# Patient Record
Sex: Male | Born: 1985 | Race: Black or African American | Hispanic: No | Marital: Single | Smoking: Current some day smoker
Health system: Southern US, Community
[De-identification: ages and names within clinical notes are randomized; demographics above are authoritative.]

---
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
## 2006-12-17 ENCOUNTER — Inpatient Hospital Stay (HOSPITAL_COMMUNITY): Admission: AC | Admit: 2006-12-17 | Discharge: 2006-12-18 | Payer: Self-pay

## 2006-12-24 ENCOUNTER — Emergency Department (HOSPITAL_COMMUNITY): Admission: EM | Admit: 2006-12-24 | Discharge: 2006-12-24 | Payer: Self-pay | Admitting: Emergency Medicine

## 2008-08-05 ENCOUNTER — Emergency Department (HOSPITAL_COMMUNITY): Admission: EM | Admit: 2008-08-05 | Discharge: 2008-08-05 | Payer: Self-pay | Admitting: Emergency Medicine

## 2009-04-27 ENCOUNTER — Emergency Department (HOSPITAL_COMMUNITY): Admission: EM | Admit: 2009-04-27 | Discharge: 2009-04-27 | Payer: Self-pay | Admitting: Emergency Medicine

## 2009-05-29 ENCOUNTER — Emergency Department (HOSPITAL_COMMUNITY): Admission: EM | Admit: 2009-05-29 | Discharge: 2009-05-29 | Payer: Self-pay | Admitting: Emergency Medicine

## 2010-06-24 LAB — RAPID STREP SCREEN (MED CTR MEBANE ONLY): Streptococcus, Group A Screen (Direct): NEGATIVE
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
## 2011-01-15 LAB — TYPE AND SCREEN

## 2011-01-15 LAB — I-STAT 8, (EC8 V) (CONVERTED LAB)
Acid-base deficit: 7 — ABNORMAL HIGH
BUN: 10
Bicarbonate: 17.8 — ABNORMAL LOW
HCT: 47
Hemoglobin: 16
Operator id: 277751
Sodium: 139

## 2011-01-15 LAB — CBC
HCT: 42.3
Hemoglobin: 14.4
MCHC: 34
RBC: 4.65
RDW: 13.6

## 2011-01-15 LAB — POCT I-STAT CREATININE: Creatinine, Ser: 1.1

## 2011-01-15 LAB — ABO/RH: ABO/RH(D): O POS

## 2011-05-10 ENCOUNTER — Emergency Department (HOSPITAL_COMMUNITY): Payer: BC Managed Care – PPO

## 2011-05-10 ENCOUNTER — Emergency Department (HOSPITAL_COMMUNITY)
Admission: EM | Admit: 2011-05-10 | Discharge: 2011-05-10 | Disposition: A | Discharge status: Home / Self Care | Payer: BC Managed Care – PPO | Attending: Emergency Medicine | Admitting: Emergency Medicine

## 2011-05-10 ENCOUNTER — Encounter (HOSPITAL_COMMUNITY): Payer: Self-pay

## 2011-05-10 DIAGNOSIS — F17200 Nicotine dependence, unspecified, uncomplicated: Secondary | ICD-10-CM | POA: Insufficient documentation

## 2011-05-10 DIAGNOSIS — H5789 Other specified disorders of eye and adnexa: Secondary | ICD-10-CM | POA: Insufficient documentation

## 2011-05-10 DIAGNOSIS — H2102 Hyphema, left eye: Secondary | ICD-10-CM

## 2011-05-10 DIAGNOSIS — H5460 Unqualified visual loss, one eye, unspecified: Secondary | ICD-10-CM | POA: Insufficient documentation

## 2011-05-10 DIAGNOSIS — H538 Other visual disturbances: Secondary | ICD-10-CM | POA: Insufficient documentation

## 2011-05-10 DIAGNOSIS — H2100 Hyphema, unspecified eye: Secondary | ICD-10-CM | POA: Insufficient documentation

## 2011-05-10 DIAGNOSIS — H53149 Visual discomfort, unspecified: Secondary | ICD-10-CM | POA: Insufficient documentation

## 2011-05-10 DIAGNOSIS — S0230XA Fracture of orbital floor, unspecified side, initial encounter for closed fracture: Secondary | ICD-10-CM | POA: Insufficient documentation

## 2011-05-10 DIAGNOSIS — H5710 Ocular pain, unspecified eye: Secondary | ICD-10-CM | POA: Insufficient documentation

## 2011-05-10 MED ORDER — HYDROCODONE-ACETAMINOPHEN 5-325 MG PO TABS: 1.0000 | ORAL_TABLET | ORAL | Status: AC | PRN

## 2011-05-10 MED ORDER — PREDNISOLONE ACETATE 1 % OP SUSP
1.0000 [drp] | Freq: Four times a day (QID) | OPHTHALMIC | Status: DC
  Filled 2011-05-10: qty 1

## 2011-05-10 MED ORDER — ATROPINE SULFATE 1 % OP SOLN
1.0000 [drp] | Freq: Two times a day (BID) | OPHTHALMIC | Status: DC
  Filled 2011-05-10: qty 2

## 2011-05-10 MED ORDER — PREDNISOLONE ACETATE 1 % OP SUSP: 1.0000 [drp] | Freq: Four times a day (QID) | OPHTHALMIC | Status: AC

## 2011-05-10 MED ORDER — ATROPINE SULFATE 1 % OP SOLN: 1.0000 [drp] | Freq: Two times a day (BID) | OPHTHALMIC | Status: AC

## 2011-05-10 MED ORDER — HYDROCODONE-ACETAMINOPHEN 5-325 MG PO TABS
2.0000 | ORAL_TABLET | Freq: Once | ORAL | Status: AC
  Administered 2011-05-10: 2 via ORAL
  Filled 2011-05-10: qty 2

## 2011-05-10 NOTE — ED Notes (Signed)
Discharge inst and meds given for patient to take home.  Instructions given

## 2011-05-10 NOTE — ED Notes (Signed)
Left eye red and painful, sts also has headaches.
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
## 2011-05-10 NOTE — ED Notes (Signed)
Both eyes drops instilled in left eye.
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
## 2011-05-10 NOTE — ED Notes (Signed)
MD at bedside. 

## 2011-05-11 NOTE — ED Provider Notes (Signed)
I saw and evaluated the patient, reviewed the resident's note and I agree with the findings and plan.  Milderd Manocchio P Selso Mannor, MD 05/11/11 0037 

## 2019-04-16 ENCOUNTER — Ambulatory Visit (HOSPITAL_COMMUNITY)
Admission: EM | Admit: 2019-04-16 | Discharge: 2019-04-16 | Disposition: A | Discharge status: Home / Self Care | Payer: Self-pay | Attending: Internal Medicine | Admitting: Internal Medicine

## 2019-04-16 ENCOUNTER — Ambulatory Visit (INDEPENDENT_AMBULATORY_CARE_PROVIDER_SITE_OTHER): Payer: Self-pay

## 2019-04-16 ENCOUNTER — Other Ambulatory Visit: Payer: Self-pay

## 2019-04-16 ENCOUNTER — Encounter (HOSPITAL_COMMUNITY): Payer: Self-pay

## 2019-04-16 DIAGNOSIS — R1013 Epigastric pain: Secondary | ICD-10-CM

## 2019-04-16 DIAGNOSIS — R109 Unspecified abdominal pain: Secondary | ICD-10-CM

## 2019-04-16 LAB — COMPREHENSIVE METABOLIC PANEL
ALT: 23 U/L (ref 0–44)
AST: 22 U/L (ref 15–41)
Albumin: 4.3 g/dL (ref 3.5–5.0)
Alkaline Phosphatase: 63 U/L (ref 38–126)
Anion gap: 9 (ref 5–15)
BUN: 17 mg/dL (ref 6–20)
CO2: 28 mmol/L (ref 22–32)
Calcium: 9.2 mg/dL (ref 8.9–10.3)
Chloride: 100 mmol/L (ref 98–111)
Creatinine, Ser: 1.4 mg/dL — ABNORMAL HIGH (ref 0.61–1.24)
GFR calc Af Amer: >60 mL/min (ref >60)
GFR calc non Af Amer: >60 mL/min (ref >60)
Glucose, Bld: 101 mg/dL — ABNORMAL HIGH (ref 70–99)
Potassium: 4 mmol/L (ref 3.5–5.1)
Sodium: 137 mmol/L (ref 135–145)
Total Bilirubin: 0.6 mg/dL (ref 0.3–1.2)
Total Protein: 7.4 g/dL (ref 6.5–8.1)

## 2019-04-16 LAB — CBC
HCT: 44.8 % (ref 39.0–52.0)
Hemoglobin: 14.5 g/dL (ref 13.0–17.0)
MCH: 29.2 pg (ref 26.0–34.0)
MCHC: 32.4 g/dL (ref 30.0–36.0)
MCV: 90.1 fL (ref 80.0–100.0)
Platelets: 279 10*3/uL (ref 150–400)
RBC: 4.97 MIL/uL (ref 4.22–5.81)
RDW: 12.5 % (ref 11.5–15.5)
WBC: 6.3 10*3/uL (ref 4.0–10.5)
nRBC: 0 % (ref 0.0–0.2)

## 2019-04-16 MED ORDER — PANTOPRAZOLE SODIUM 20 MG PO TBEC: 20.0000 mg | DELAYED_RELEASE_TABLET | Freq: Every day | ORAL | 0 refills | Status: AC

## 2019-04-16 NOTE — ED Provider Notes (Signed)
[facility]    CSN: [id number] Arrival date & time: 04/16/19  1816      History   Chief Complaint Chief Complaint  Patient presents with  . Abdominal Pain    HPI [name] is a 34 y.o. male with no past medical history comes to urgent care with complaints of abdominal pain that started 3 days ago.  Patient says pain is mainly in the right upper quadrant and epigastric region.  Pain has been intermittent.  He denies any aggravating factors.  He is tried several bouts of laxatives with no improvement.  He denies any history of constipation.  No nausea or vomiting.  No abdominal distention.  He describes the pain as his intestines being twisted over itself.  No fever or chills.  No sick contacts.  No changes in weight.  Patient denies any over-the-counter pain medication use.   HPI  History reviewed. No pertinent past medical history.  There are no problems to display for this patient.   History reviewed. No pertinent surgical history.     Home Medications    Prior to Admission medications   Medication Sig Start Date End Date Taking? Authorizing Provider  pantoprazole (PROTONIX) 20 MG tablet Take 1 tablet (20 mg total) by mouth daily. 04/16/19   LampteyBritta Mccreedy, MD    Family History Family History  Problem Relation Age of Onset  . Healthy Mother   . Healthy Father     Social History Social History   Tobacco Use  . Smoking status: Current Some Day Smoker    Types: E-cigarettes  . Smokeless tobacco: Never Used  . Tobacco comment: rare  Substance Use Topics  . Alcohol use: Yes    Comment: occ  . Drug use: No     Allergies   Patient has no known allergies.   Review of Systems Review of Systems  Constitutional: Negative for activity change, chills, fatigue and fever.  HENT: Negative.   Respiratory: Negative.  Negative for chest tightness, shortness of breath and wheezing.   Cardiovascular: Negative.   Gastrointestinal: Negative for  diarrhea, nausea and rectal pain.  Genitourinary: Negative.   Musculoskeletal: Negative.  Negative for arthralgias, joint swelling and myalgias.  Neurological: Negative for dizziness, light-headedness and headaches.  Psychiatric/Behavioral: Negative for confusion and decreased concentration.     Physical Exam Triage Vital Signs ED Triage Vitals  Enc Vitals Group     BP      Pulse      Resp      Temp      Temp src      SpO2      Weight      Height      Head Circumference      Peak Flow      Pain Score      Pain Loc      Pain Edu?      Excl. in GC?    No data found.  Updated Vital Signs BP (!) 120/97 (BP Location: Left Arm)   Pulse 92   Temp 99.1 F (37.3 C) (Oral)   Resp 16   SpO2 100%   Visual Acuity Right Eye Distance:   Left Eye Distance:   Bilateral Distance:    Right Eye Near:   Left Eye Near:    Bilateral Near:     Physical Exam Vitals and nursing note reviewed.  Constitutional:      General: He is not in acute distress.  Appearance: He is well-developed. He is not ill-appearing.  Cardiovascular:     Rate and Rhythm: Normal rate and regular rhythm.  Pulmonary:     Effort: Pulmonary effort is normal. No respiratory distress.     Breath sounds: Normal breath sounds. No rhonchi or rales.  Abdominal:     General: Bowel sounds are normal.     Palpations: Abdomen is rigid. There is no shifting dullness, fluid wave, hepatomegaly or splenomegaly.     Tenderness: There is no abdominal tenderness.     Hernia: No hernia is present.  Skin:    General: Skin is warm.     Coloration: Skin is not jaundiced, mottled or pale.  Neurological:     Mental Status: He is alert.      UC Treatments / Results  Labs (all labs ordered are listed, but only abnormal results are displayed) Labs Reviewed  COMPREHENSIVE METABOLIC PANEL - Abnormal; Notable for the following components:      Result Value   Glucose, Bld 101 (*)    Creatinine, Ser 1.40 (*)    All other  components within normal limits  CBC    EKG   Radiology No results found.  Procedures Procedures (including critical care time)  Medications Ordered in UC Medications - No data to display  Initial Impression / Assessment and Plan / UC Course  I have reviewed the triage vital signs and the nursing notes.  Pertinent labs & imaging results that were available during my care of the patient were reviewed by me and considered in my medical decision making (see chart for details).     1.  Abdominal pain: KUB is negative for acute intra-abdominal process.  No evidence of constipation. Patient's pain is in the epigastric region.  Patient will get a trial of Protonix.  If patient's symptoms does not improve after that, he may benefit from gastroenterology evaluation.  If patient symptoms worsens, he is advised to reach out to the urgent care #5 the virtual visit platform or in person. Final Clinical Impressions(s) / UC Diagnoses   Final diagnoses:  Abdominal pain, epigastric   Discharge Instructions   None    ED Prescriptions    Medication Sig Dispense Auth. Provider   pantoprazole (PROTONIX) 20 MG tablet Take 1 tablet (20 mg total) by mouth daily. 30 tablet Derral Colucci, Myrene Galas, MD     PDMP not reviewed this encounter.   Chase Picket, MD 04/19/19 [phone]

## 2019-04-16 NOTE — ED Triage Notes (Signed)
Patient presents to Urgent Care with complaints of RUQ squeezing abdominal pain since a few nights ago. Patient reports it faded during the following day and has been intermittent ever since, pt states his BMs have been irregular, has tried some prune juice and ex-lax.

## 2019-04-28 ENCOUNTER — Emergency Department (HOSPITAL_COMMUNITY): Payer: Self-pay

## 2019-04-28 ENCOUNTER — Other Ambulatory Visit: Payer: Self-pay

## 2019-04-28 ENCOUNTER — Emergency Department (HOSPITAL_COMMUNITY)
Admission: EM | Admit: 2019-04-28 | Discharge: 2019-04-28 | Disposition: A | Discharge status: Home / Self Care | Payer: Self-pay | Attending: Emergency Medicine | Admitting: Emergency Medicine

## 2019-04-28 ENCOUNTER — Encounter (HOSPITAL_COMMUNITY): Payer: Self-pay

## 2019-04-28 DIAGNOSIS — S93402A Sprain of unspecified ligament of left ankle, initial encounter: Secondary | ICD-10-CM | POA: Insufficient documentation

## 2019-04-28 DIAGNOSIS — Y9367 Activity, basketball: Secondary | ICD-10-CM | POA: Insufficient documentation

## 2019-04-28 DIAGNOSIS — F17210 Nicotine dependence, cigarettes, uncomplicated: Secondary | ICD-10-CM | POA: Insufficient documentation

## 2019-04-28 DIAGNOSIS — Y999 Unspecified external cause status: Secondary | ICD-10-CM | POA: Insufficient documentation

## 2019-04-28 DIAGNOSIS — Y92310 Basketball court as the place of occurrence of the external cause: Secondary | ICD-10-CM | POA: Insufficient documentation

## 2019-04-28 DIAGNOSIS — Y33XXXA Other specified events, undetermined intent, initial encounter: Secondary | ICD-10-CM | POA: Insufficient documentation

## 2019-04-28 MED ORDER — NAPROXEN 500 MG PO TABS: 500.0000 mg | ORAL_TABLET | Freq: Two times a day (BID) | ORAL | 0 refills | Status: AC

## 2019-04-28 MED ORDER — IBUPROFEN 400 MG PO TABS
600.0000 mg | ORAL_TABLET | Freq: Once | ORAL | Status: AC
  Administered 2019-04-28: 600 mg via ORAL
  Filled 2019-04-28: qty 1

## 2019-04-28 MED ORDER — OXYCODONE-ACETAMINOPHEN 5-325 MG PO TABS
1.0000 | ORAL_TABLET | Freq: Once | ORAL | Status: AC
  Administered 2019-04-28: 1 via ORAL
  Filled 2019-04-28: qty 1

## 2019-04-28 NOTE — ED Notes (Signed)
Ortho placing left ankle brace on pt then pt will be discharged.

## 2019-04-28 NOTE — Discharge Instructions (Signed)
Wear the ankle brace and use the crutches as tolerated. You can start to bear weight on the leg as tolerated. Complete range of motion exercises to prevent stiffness. Follow-up with orthopedic provider listed below. Return to the ED if you start to experience worsening pain, swelling, redness or warmth of your joint or additional injuries.

## 2019-04-28 NOTE — Progress Notes (Signed)
Orthopedic Tech Progress Note Patient Details:  [name] 07/17/1985 [id number]  Ortho Devices Type of Ortho Device: ASO Ortho Device/Splint Location: LLE Ortho Device/Splint Interventions: Ordered, Application   Post Interventions Patient Tolerated: Well Instructions Provided: Care of device   Jennye Moccasin 04/28/2019, 7:16 PM

## 2019-04-28 NOTE — ED Provider Notes (Signed)
[name] Provider Note   CSN: [id number] Arrival date & time: 04/28/19  1745     History Chief Complaint  Patient presents with  . Ankle Injury    [name] is a 34 y.o. male who presents to ED with a chief complaint of left ankle pain.  States that he was playing basketball when he got fouled and had an injury to his left ankle.  It felt like the ankle inverted under him.  He denies any head injury or loss of consciousness.  He has been able to bear weight with pain after the accident.  Denies any pain prior to the injury.  Denies any prior fracture, dislocation or procedure in the area, history of septic joint, numbness in arms or legs, headache or blurry vision.  HPI     History reviewed. No pertinent past medical history.  There are no problems to display for this patient.   History reviewed. No pertinent surgical history.     Family History  Problem Relation Age of Onset  . Healthy Mother   . Healthy Father     Social History   Tobacco Use  . Smoking status: Current Some Day Smoker    Types: E-cigarettes  . Smokeless tobacco: Never Used  . Tobacco comment: rare  Substance Use Topics  . Alcohol use: Yes    Comment: occ  . Drug use: No    Home Medications Prior to Admission medications   Medication Sig Start Date End Date Taking? Authorizing Provider  naproxen (NAPROSYN) 500 MG tablet Take 1 tablet (500 mg total) by mouth 2 (two) times daily. 04/28/19   Evie Croston, PA-C  pantoprazole (PROTONIX) 20 MG tablet Take 1 tablet (20 mg total) by mouth daily. 04/16/19   LampteyMyrene Galas, MD    Allergies    Natural vegetable orange [psyllium]  Review of Systems   Review of Systems  Constitutional: Negative for chills and fever.  Musculoskeletal: Positive for arthralgias and joint swelling. Negative for gait problem.  Skin: Negative for wound.  Neurological: Negative for weakness and numbness.    Physical  Exam Updated Vital Signs BP 118/71 (BP Location: Right Arm)   Pulse 84   Temp 98.9 F (37.2 C) (Oral)   Resp 18   SpO2 100%   Physical Exam Vitals and nursing note reviewed.  Constitutional:      General: He is not in acute distress.    Appearance: He is well-developed. He is not diaphoretic.  HENT:     Head: Normocephalic and atraumatic.  Eyes:     General: No scleral icterus.    Conjunctiva/sclera: Conjunctivae normal.  Pulmonary:     Effort: Pulmonary effort is normal. No respiratory distress.  Musculoskeletal:        General: Swelling and tenderness present.     Cervical back: Normal range of motion.     Comments: Tenderness palpation of the left lateral ankle with associated edema.  Able to flex and extend the ankle but pain noted with inversion and eversion.  2+ DP pulse palpated.  No overlying erythema, warmth or wound noted.  Sensation intact to light touch of bilateral lower extremities.  Skin:    Findings: No rash.  Neurological:     Mental Status: He is alert.     ED Results / Procedures / Treatments   Labs (all labs ordered are listed, but only abnormal results are displayed) Labs Reviewed - No data to display  EKG None  Radiology DG Ankle Complete Left  Result Date: 04/28/2019 CLINICAL DATA:  Status post trauma. EXAM: LEFT ANKLE COMPLETE - 3+ VIEW COMPARISON:  None. FINDINGS: There is no evidence of fracture, dislocation, or joint effusion. There is no evidence of arthropathy or other focal bone abnormality. Mild lateral soft tissue swelling is seen. IMPRESSION: Mild lateral soft tissue swelling without evidence of acute osseous abnormality. Electronically Signed   By: Aram Candela M.D.   On: 04/28/2019 18:25    Procedures Procedures (including critical care time)  Medications Ordered in ED Medications  oxyCODONE-acetaminophen (PERCOCET/ROXICET) 5-325 MG per tablet 1 tablet (has no administration in time range)  ibuprofen (ADVIL) tablet 600 mg  (has no administration in time range)    ED Course  I have reviewed the triage vital signs and the nursing notes.  Pertinent labs & imaging results that were available during my care of the patient were reviewed by me and considered in my medical decision making (see chart for details).    MDM Rules/Calculators/A&P                      34 year old male presenting to the ED with left ankle pain.  He had an injury while playing basketball just prior to arrival.  He has tenderness palpation and edema of the left lateral ankle without significant changes to range of motion, overlying skin changes or changes to sensation.  He is able to bear weight with pain.  X-ray shows no acute osseous abnormality but does show soft tissue swelling in the area.  Suspect that his symptoms could be due to a sprain.  We will place him in an ankle ASO, crutches as needed and give anti-inflammatories as well as orthopedic follow-up.  At this time I doubt infectious or vascular cause of his symptoms at this he only started having any pain after his injury.  Patient is agreeable to the plan.  Pain controlled here.  Patient is hemodynamically stable, in NAD. Evaluation does not show pathology that would require ongoing emergent intervention or inpatient treatment. I explained the diagnosis to the patient. Pain has been managed and has no complaints prior to discharge. Patient is comfortable with above plan and is stable for discharge at this time. All questions were answered prior to disposition. Strict return precautions for returning to the ED were discussed. Encouraged follow up with PCP.   An After Visit Summary was printed and given to the patient.   Portions of this note were generated with Scientist, clinical (histocompatibility and immunogenetics). Dictation errors may occur despite best attempts at proofreading.  Final Clinical Impression(s) / ED Diagnoses Final diagnoses:  Sprain of left ankle, unspecified ligament, initial encounter    Rx  / DC Orders ED Discharge Orders         Ordered    naproxen (NAPROSYN) 500 MG tablet  2 times daily     04/28/19 1835           Dietrich Pates, PA-C 04/28/19 Roselie Awkward, MD 04/28/19 2242

## 2019-04-28 NOTE — ED Triage Notes (Signed)
Pt arrived POV C/O left ankle pain. Pt was playing basketball when he was fouled and rolled his ankle. Pt reports pain and not being able to put pressure on the left foot. Obvious swelling noted.

## 2020-04-30 ENCOUNTER — Encounter (HOSPITAL_COMMUNITY): Payer: Self-pay

## 2020-04-30 ENCOUNTER — Emergency Department (HOSPITAL_COMMUNITY): Payer: 59

## 2020-04-30 ENCOUNTER — Other Ambulatory Visit: Payer: Self-pay

## 2020-04-30 ENCOUNTER — Emergency Department (HOSPITAL_COMMUNITY)
Admission: EM | Admit: 2020-04-30 | Discharge: 2020-04-30 | Disposition: A | Discharge status: Home / Self Care | Payer: 59 | Attending: Emergency Medicine | Admitting: Emergency Medicine

## 2020-04-30 DIAGNOSIS — R748 Abnormal levels of other serum enzymes: Secondary | ICD-10-CM | POA: Insufficient documentation

## 2020-04-30 DIAGNOSIS — F17290 Nicotine dependence, other tobacco product, uncomplicated: Secondary | ICD-10-CM | POA: Diagnosis not present

## 2020-04-30 DIAGNOSIS — N50811 Right testicular pain: Secondary | ICD-10-CM | POA: Insufficient documentation

## 2020-04-30 DIAGNOSIS — R319 Hematuria, unspecified: Secondary | ICD-10-CM | POA: Insufficient documentation

## 2020-04-30 LAB — COMPREHENSIVE METABOLIC PANEL
ALT: 20 U/L (ref 0–44)
AST: 19 U/L (ref 15–41)
Albumin: 3.8 g/dL (ref 3.5–5.0)
Alkaline Phosphatase: 55 U/L (ref 38–126)
Anion gap: 10 (ref 5–15)
BUN: 8 mg/dL (ref 6–20)
CO2: 23 mmol/L (ref 22–32)
Calcium: 9.3 mg/dL (ref 8.9–10.3)
Chloride: 104 mmol/L (ref 98–111)
Creatinine, Ser: 1.04 mg/dL (ref 0.61–1.24)
GFR, Estimated: >60 mL/min (ref >60)
Glucose, Bld: 90 mg/dL (ref 70–99)
Potassium: 4.1 mmol/L (ref 3.5–5.1)
Sodium: 137 mmol/L (ref 135–145)
Total Bilirubin: 0.5 mg/dL (ref 0.3–1.2)
Total Protein: 6.5 g/dL (ref 6.5–8.1)

## 2020-04-30 LAB — URINALYSIS, ROUTINE W REFLEX MICROSCOPIC
Bacteria, UA: NONE SEEN
Bilirubin Urine: NEGATIVE
Glucose, UA: NEGATIVE mg/dL
Ketones, ur: NEGATIVE mg/dL
Leukocytes,Ua: NEGATIVE
Nitrite: NEGATIVE
Protein, ur: NEGATIVE mg/dL
Specific Gravity, Urine: 1.017 (ref 1.005–1.030)
pH: 5 (ref 5.0–8.0)

## 2020-04-30 LAB — CBC WITH DIFFERENTIAL/PLATELET
Abs Immature Granulocytes: 0.01 10*3/uL (ref 0.00–0.07)
Basophils Absolute: 0.1 10*3/uL (ref 0.0–0.1)
Basophils Relative: 1 %
Eosinophils Absolute: 0.1 10*3/uL (ref 0.0–0.5)
Eosinophils Relative: 2 %
HCT: 42 % (ref 39.0–52.0)
Hemoglobin: 13.5 g/dL (ref 13.0–17.0)
Immature Granulocytes: 0 %
Lymphocytes Relative: 51 %
Lymphs Abs: 2.7 10*3/uL (ref 0.7–4.0)
MCH: 29.6 pg (ref 26.0–34.0)
MCHC: 32.1 g/dL (ref 30.0–36.0)
MCV: 92.1 fL (ref 80.0–100.0)
Monocytes Absolute: 0.4 10*3/uL (ref 0.1–1.0)
Monocytes Relative: 7 %
Neutro Abs: 2.1 10*3/uL (ref 1.7–7.7)
Neutrophils Relative %: 39 %
Platelets: 279 10*3/uL (ref 150–400)
RBC: 4.56 MIL/uL (ref 4.22–5.81)
RDW: 12.8 % (ref 11.5–15.5)
WBC: 5.3 10*3/uL (ref 4.0–10.5)
nRBC: 0 % (ref 0.0–0.2)

## 2020-04-30 LAB — LIPASE, BLOOD: Lipase: 142 U/L — ABNORMAL HIGH (ref 11–51)

## 2020-04-30 MED ORDER — FENTANYL CITRATE (PF) 100 MCG/2ML IJ SOLN
50.0000 ug | Freq: Once | INTRAMUSCULAR | Status: AC
  Administered 2020-04-30: 50 ug via INTRAVENOUS
  Filled 2020-04-30: qty 2

## 2020-04-30 NOTE — Discharge Instructions (Addendum)
At this time there does not appear to be the presence of an emergent medical condition, however there is always the potential for conditions to change. Please read and follow the below instructions.  Please return to the Emergency Department immediately for any new or worsening symptoms. Please be sure to follow up with your Primary Care Provider within one week regarding your visit today; please call their office to schedule an appointment even if you are feeling better for a follow-up visit. Your lipase level was elevated today.  Please have this rechecked by your primary care provider in the next week.  This may be elevated due to a number of reasons, 1 of those being alcohol use.  If you drink alcohol please attempt to cut back.  Is important to have this lipase level rechecked to make sure it is not increasing over time.  Return to the ER if you develop any abdominal pain nausea or vomiting. Please call the specialist at [facility] urology for further evaluation of the blood in your urine and your testicle pain. 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  Go to the nearest Emergency Department immediately if: You have fever or chills You experience severe pain and redness of your scrotum. You have burning when you pee or see blood in your urine You have penile discharge or rash You have abdominal pain nausea or vomiting You have any new/concerning or worsening of symptoms  Please read the additional information packets attached to your discharge summary.  Do not take your medicine if  develop an itchy rash, swelling in your mouth or lips, or difficulty breathing; call 911 and seek immediate emergency medical attention if this occurs.  You may review your lab tests and imaging results in their entirety on your MyChart account.  Please discuss all results of fully with your primary care  provider and other specialist at your follow-up visit.  Note: Portions of this text may have been transcribed using voice recognition software. Every effort was made to ensure accuracy; however, inadvertent computerized transcription errors may still be present.

## 2020-04-30 NOTE — ED Notes (Signed)
Patient transported to Ultrasound 

## 2020-04-30 NOTE — ED Notes (Signed)
Patient verbalizes understanding of discharge instructions. Opportunity for questioning and answers were provided. Armband removed by staff, pt discharged from ED.  

## 2020-04-30 NOTE — ED Provider Notes (Signed)
[facility] EMERGENCY DEPARTMENT Provider Note   CSN: [id number] Arrival date & time: 04/30/20  0546     History Chief Complaint  Patient presents with  . Testicle Pain    [name] is a 35 y.o. male otherwise healthy no daily medication use.  Patient arrives for sudden onset right testicular pain around 2:30 AM, he reports he rolled over in his sleep and pain began he describes a severe sharp pain of his right testicle waxes and wanes without clear aggravating or alleviating factors no medication prior to arrival he reports pain will occasionally radiate up to his right lower back and is associated with some tingling sensation.  He reports that occasionally he will feel has of pain is on both testicles but is never severe in the left side.  Denies similar in the past.  Reports he is not sexually active and is not concerned for STI.  Denies fever/chills, nausea/vomiting, chest pain/shortness of breath, cough, abdominal pain, saddle paresthesias, bowel/bladder incontinence, urinary retention, fall/injury or any additional concerns.  HPI     History reviewed. No pertinent past medical history.  There are no problems to display for this patient.   History reviewed. No pertinent surgical history.     Family History  Problem Relation Age of Onset  . Healthy Mother   . Healthy Father     Social History   Tobacco Use  . Smoking status: Current Some Day Smoker    Types: E-cigarettes  . Smokeless tobacco: Never Used  . Tobacco comment: rare  Substance Use Topics  . Alcohol use: Yes    Comment: occ  . Drug use: No    Home Medications Prior to Admission medications   Medication Sig Start Date End Date Taking? Authorizing Provider  naproxen (NAPROSYN) 500 MG tablet Take 1 tablet (500 mg total) by mouth 2 (two) times daily. 04/28/19   Khatri, Hina, PA-C  pantoprazole (PROTONIX) 20 MG tablet Take 1 tablet (20 mg total) by mouth daily. 04/16/19   Lamptey,  Britta Mccreedy, MD    Allergies    Natural vegetable orange [psyllium]  Review of Systems   Review of Systems Ten systems are reviewed and are negative for acute change except as noted in the HPI  Physical Exam Updated Vital Signs BP (!) 124/94 (BP Location: Left Arm)   Pulse 68   Temp 97.8 F (36.6 C) (Oral)   Resp 16   Ht 5\' 7"  (1.702 m)   Wt 79.4 kg   SpO2 100%   BMI 27.41 kg/m   Physical Exam Constitutional:      General: He is not in acute distress.    Appearance: Normal appearance. He is well-developed. He is not ill-appearing or diaphoretic.  HENT:     Head: Normocephalic and atraumatic.  Eyes:     General: Vision grossly intact. Gaze aligned appropriately.     Pupils: Pupils are equal, round, and reactive to light.  Neck:     Trachea: Trachea and phonation normal.  Cardiovascular:     Rate and Rhythm: Normal rate and regular rhythm.  Pulmonary:     Effort: Pulmonary effort is normal. No respiratory distress.  Abdominal:     General: Abdomen is flat. There is no distension.     Palpations: Abdomen is soft.     Tenderness: There is no abdominal tenderness. There is no guarding or rebound.  Genitourinary:    Comments: Chaperone present during genital exam .  No external genital  lesions noted, no bumps on head of penis, specifically no vesicles concerning for herpes or chancre suggestive of syphilis.  No pain with palpation of the penis/glans, no discharge or urethritis noted.  Scrotum and testicles without erythema/swelling or tenderness to palpation. Cremasteric reflex intact bilaterally. No palpable hernia noted.  Musculoskeletal:        General: Normal range of motion.     Cervical back: Normal range of motion.  Skin:    General: Skin is warm and dry.  Neurological:     Mental Status: He is alert.     GCS: GCS eye subscore is 4. GCS verbal subscore is 5. GCS motor subscore is 6.     Comments: Speech is clear and goal oriented, follows  commands Major Cranial nerves without deficit, no facial droop Moves extremities without ataxia, coordination intact  Psychiatric:        Behavior: Behavior normal.     ED Results / Procedures / Treatments   Labs (all labs ordered are listed, but only abnormal results are displayed) Labs Reviewed  URINALYSIS, ROUTINE W REFLEX MICROSCOPIC - Abnormal; Notable for the following components:      Result Value   Hgb urine dipstick MODERATE (*)    All other components within normal limits  LIPASE, BLOOD - Abnormal; Notable for the following components:   Lipase 142 (*)    All other components within normal limits  CBC WITH DIFFERENTIAL/PLATELET  COMPREHENSIVE METABOLIC PANEL    EKG None  Radiology CT Renal Stone Study  Result Date: 04/30/2020 CLINICAL DATA:  35 year old male with right flank, testicular pain and hematuria since 0200 hours. EXAM: CT ABDOMEN AND PELVIS WITHOUT CONTRAST TECHNIQUE: Multidetector CT imaging of the abdomen and pelvis was performed following the standard protocol without IV contrast. COMPARISON:  Scrotal ultrasound with Doppler earlier today. CT Chest, Abdomen, and Pelvis 12/17/2006. FINDINGS: Lower chest: Negative.  No pericardial or pleural effusion. Hepatobiliary: Negative noncontrast liver and gallbladder. Pancreas: Negative. Spleen: Negative. Adrenals/Urinary Tract: Normal adrenal glands. Noncontrast kidneys appear symmetric and normal. No hydronephrosis or perinephric stranding. No nephrolithiasis. Ureters appear symmetric and normal. The distal ureters are decompressed. Diminutive and unremarkable urinary bladder. No urinary calculus identified. Stomach/Bowel: Negative large bowel aside from some redundancy and retained stool. Normal appendix arising from the cecum on series 3, image 54, is elongated and continues to the midline. Negative terminal ileum. No dilated small bowel. Decompressed stomach and duodenum. No free air, free fluid, mesenteric stranding.  Vascular/Lymphatic: Normal caliber abdominal aorta. No calcified atherosclerosis. Vascular patency is not evaluated in the absence of IV contrast. No lymphadenopathy. Reproductive: Negative; punctate dystrophic calcification of the right testis incidentally noted (inconsequential). Other: No pelvic free fluid. Musculoskeletal: Minor dextroconvex scoliosis, otherwise negative. IMPRESSION: Negative noncontrast CT Abdomen and Pelvis. No urinary calculus or explanation for flank pain.  Normal appendix. Electronically Signed   By: Odessa Fleming M.D.   On: 04/30/2020 09:52   US SCROTUM W/DOPPLER  Result Date: 04/30/2020 CLINICAL DATA:  35 year old male with right testicular pain since 0200 hours. EXAM: SCROTAL ULTRASOUND DOPPLER ULTRASOUND OF THE TESTICLES TECHNIQUE: Complete ultrasound examination of the testicles, epididymis, and other scrotal structures was performed. Color and spectral Doppler ultrasound were also utilized to evaluate blood flow to the testicles. COMPARISON:  None. FINDINGS: Right testicle Measurements: 4.6 x 2.0 x 3.1 cm. No mass or microlithiasis visualized. Left testicle Measurements: 4.3 x 2.2 x 2.5 cm. No mass or microlithiasis visualized. Right epididymis: Normal in size and appearance. No hypervascularity (image  18). Left epididymis:  Normal in size and appearance. Hydrocele:  None visualized. Varicocele:  None visualized. Pulsed Doppler interrogation of both testes demonstrates symmetric, normal low resistance arterial and venous waveforms bilaterally. IMPRESSION: Normal scrotal ultrasound with Doppler. Negative for torsion or mass. Electronically Signed   By: Odessa Fleming M.D.   On: 04/30/2020 06:35    Procedures Procedures   Medications Ordered in ED Medications  fentaNYL (SUBLIMAZE) injection 50 mcg (50 mcg Intravenous Given 04/30/20 1008)    ED Course  I have reviewed the triage vital signs and the nursing notes.  Pertinent labs & imaging results that were available during my care of  the patient were reviewed by me and considered in my medical decision making (see chart for details).  Clinical Course as of 04/30/20 1510  Wed Apr 30, 2020  1017 Elizabeth [BM]  3299 Dr. Laverle Patter [BM]    Clinical Course User Index [BM] Elizabeth Palau   MDM Rules/Calculators/A&P                         Additional history obtained from: 1. Nursing notes from this visit. 2. Review of electronic medical records.  Patient had negative Covid test 04/15/2020.  No pertinent recent ER visits. ---------------------------------------------- I ordered, reviewed and interpreted labs which include: CBC within normal limits no leukocytosis to suggest bacterial infection, no anemia. CMP within normal limits, no emergent electrolyte derangement, AKI, LFT elevations or gap. Urinalysis shows hemoglobin, no evidence for infection. Lipase elevated 142, patient denies alcohol use or symptoms suggestive of pancreatitis.  Encourage patient to follow-up with PCP for recheck of lipase level.  CT Renal Stone Study:  IMPRESSION:  Negative noncontrast CT Abdomen and Pelvis.  No urinary calculus or explanation for flank pain. Normal appendix.   US Scrotum w/ Doppler:  IMPRESSION:  Normal scrotal ultrasound with Doppler. Negative for torsion or  mass.  - Patient had arrived last night for sudden onset testicle pain radiating up into his right flank.  Ultrasound performed in triage was negative.  There was concern for possible kidney stone given hemoglobin on UA.  Follow-up CT scan was negative except for a small calcification on the right testicle.  I spoke with Dr. Laverle Patter at 2:03 PM, [facility] who reviewed patient's imaging and work-up today.  Dr. Laverle Patter advises this is incidental, he advised the patient use anti-inflammatories and can follow-up with his office as outpatient. - Patient reassessed he is resting comfortably no acute distress sitting up in bed fully dressed, he states understanding  of work-up above and care plan.  Encourage patient to have lipase level rechecked by PCP and to call urology to schedule follow-up appointments for further evaluation.  At this time there does not appear to be any evidence of an acute emergency medical condition and the patient appears stable for discharge with appropriate outpatient follow up. Diagnosis was discussed with patient who verbalizes understanding of care plan and is agreeable to discharge. I have discussed return precautions with patient who verbalizes understanding. Patient encouraged to follow-up with their PCP and Urology. All questions answered.  Patient's case discussed with Dr. Anitra Lauth who agrees with plan to discharge with follow-up.   Note: Portions of this report may have been transcribed using voice recognition software. Every effort was made to ensure accuracy; however, inadvertent computerized transcription errors may still be present. Final Clinical Impression(s) / ED Diagnoses Final diagnoses:  Right testicular pain  Elevated lipase  Hematuria, unspecified  type    Rx / DC Orders ED Discharge Orders    None       Elizabeth Palau 04/30/20 1510    Gwyneth Sprout, MD 05/03/20 [phone]

## 2020-04-30 NOTE — ED Triage Notes (Signed)
Patient reports he was readjusting in bed and had sudden onset on bilateral testicle pain, unsure if he twisted them, denies pain on urination, R testicle feels numb.

## 2022-02-26 ENCOUNTER — Emergency Department (HOSPITAL_COMMUNITY): Payer: Managed Care, Other (non HMO)

## 2022-02-26 ENCOUNTER — Ambulatory Visit (HOSPITAL_COMMUNITY)
Admission: EM | Admit: 2022-02-26 | Discharge: 2022-02-27 | Disposition: A | Discharge status: Home / Self Care | Payer: Managed Care, Other (non HMO) | Attending: Emergency Medicine | Admitting: Emergency Medicine

## 2022-02-26 ENCOUNTER — Other Ambulatory Visit: Payer: Self-pay

## 2022-02-26 DIAGNOSIS — S4291XA Fracture of right shoulder girdle, part unspecified, initial encounter for closed fracture: Secondary | ICD-10-CM

## 2022-02-26 DIAGNOSIS — F17290 Nicotine dependence, other tobacco product, uncomplicated: Secondary | ICD-10-CM | POA: Insufficient documentation

## 2022-02-26 DIAGNOSIS — Y9321 Activity, ice skating: Secondary | ICD-10-CM | POA: Insufficient documentation

## 2022-02-26 DIAGNOSIS — S43004A Unspecified dislocation of right shoulder joint, initial encounter: Secondary | ICD-10-CM | POA: Insufficient documentation

## 2022-02-26 MED ORDER — DIAZEPAM 5 MG/ML IJ SOLN
INTRAMUSCULAR | Status: AC
  Administered 2022-02-26: 5 mg via INTRAMUSCULAR
  Filled 2022-02-26: qty 2

## 2022-02-26 MED ORDER — OXYCODONE-ACETAMINOPHEN 5-325 MG PO TABS
1.0000 | ORAL_TABLET | Freq: Once | ORAL | Status: AC
  Administered 2022-02-26: 1 via ORAL
  Filled 2022-02-26: qty 1

## 2022-02-26 MED ORDER — DIAZEPAM 5 MG/ML IJ SOLN: 5.0000 mg | Freq: Once | INTRAMUSCULAR | Status: AC

## 2022-02-26 NOTE — ED Notes (Signed)
Upon arrival to room from triage pt taken to bathroom by support person - pt wheeled back to room at this time by support person

## 2022-02-26 NOTE — ED Provider Triage Note (Signed)
Emergency Medicine Provider Triage Evaluation Note  [name] , a 36 y.o. male  was evaluated in triage.  Pt complains of right shoulder pain after falling while ice-skating earlier this evening.  Patient denies previous history of same.  He reports that he is unable to move the arm secondary to pain.  He arrives with splint around the elbow but reports that his pain is located around the left shoulder only.  He denies any head injury, loss of consciousness, blood thinners.  Review of Systems  Positive: Shoulder injury Negative: Head injury  Physical Exam  BP (!) 159/97   Pulse 75   Temp 98.6 F (37 C) (Oral)   Resp 19   SpO2 100%  Gen:   Awake, no distress   Resp:  Normal effort  MSK:   Moves extremities without difficulty  Other:  Patient with questionable empty space of anterior right shoulder, and refuses to crossarm adduction secondary to pain, equivocal for anterior dislocation on my exam  Medical Decision Making  Medically screening exam initiated at 10:02 PM.  Appropriate orders placed.  [name] was informed that the remainder of the evaluation will be completed by another provider, this initial triage assessment does not replace that evaluation, and the importance of remaining in the ED until their evaluation is complete.  Workup initiated   Olene Floss, [place] 02/26/22 2205

## 2022-02-26 NOTE — ED Triage Notes (Signed)
Pt was ice skating and fell onto right shoulder  Feels that he has dislocated it. No obvious dislocation on exam.  Given 600 mg tylenol by EMS.  PMS intact.

## 2022-02-27 ENCOUNTER — Emergency Department (HOSPITAL_COMMUNITY): Payer: Managed Care, Other (non HMO)

## 2022-02-27 ENCOUNTER — Encounter (HOSPITAL_COMMUNITY): Payer: Self-pay | Admitting: Orthopedic Surgery

## 2022-02-27 ENCOUNTER — Emergency Department (HOSPITAL_COMMUNITY): Payer: Managed Care, Other (non HMO) | Admitting: Anesthesiology

## 2022-02-27 ENCOUNTER — Encounter (HOSPITAL_COMMUNITY)
Admission: EM | Disposition: A | Discharge status: Home / Self Care | Payer: Self-pay | Source: Home / Self Care | Attending: Emergency Medicine

## 2022-02-27 ENCOUNTER — Emergency Department (HOSPITAL_BASED_OUTPATIENT_CLINIC_OR_DEPARTMENT_OTHER): Payer: Managed Care, Other (non HMO) | Admitting: Anesthesiology

## 2022-02-27 DIAGNOSIS — S43004A Unspecified dislocation of right shoulder joint, initial encounter: Secondary | ICD-10-CM

## 2022-02-27 HISTORY — PX: HIP CLOSED REDUCTION: SHX983

## 2022-02-27 SURGERY — CLOSED REDUCTION, HIP
Anesthesia: General | Site: Hip | Laterality: Right

## 2022-02-27 MED ORDER — PROPOFOL 10 MG/ML IV BOLUS
INTRAVENOUS | Status: DC | PRN
  Administered 2022-02-27: 150 mg via INTRAVENOUS

## 2022-02-27 MED ORDER — ACETAMINOPHEN 10 MG/ML IV SOLN
INTRAVENOUS | Status: AC
  Filled 2022-02-27: qty 100

## 2022-02-27 MED ORDER — FENTANYL CITRATE (PF) 250 MCG/5ML IJ SOLN
INTRAMUSCULAR | Status: AC
  Filled 2022-02-27: qty 5

## 2022-02-27 MED ORDER — OXYCODONE HCL 5 MG PO TABS
ORAL_TABLET | ORAL | Status: AC
  Filled 2022-02-27: qty 1

## 2022-02-27 MED ORDER — ACETAMINOPHEN 10 MG/ML IV SOLN
1000.0000 mg | Freq: Once | INTRAVENOUS | Status: DC | PRN
  Administered 2022-02-27: 1000 mg via INTRAVENOUS

## 2022-02-27 MED ORDER — PROPOFOL 10 MG/ML IV BOLUS
INTRAVENOUS | Status: AC | PRN
  Administered 2022-02-27 (×2): 20 mg via INTRAVENOUS
  Administered 2022-02-27 (×2): 40 mg via INTRAVENOUS
  Administered 2022-02-27: 20 mg via INTRAVENOUS

## 2022-02-27 MED ORDER — OXYCODONE HCL 5 MG/5ML PO SOLN: 5.0000 mg | Freq: Once | ORAL | Status: AC | PRN

## 2022-02-27 MED ORDER — PROPOFOL 10 MG/ML IV BOLUS
INTRAVENOUS | Status: AC
  Filled 2022-02-27: qty 20

## 2022-02-27 MED ORDER — KETAMINE HCL 10 MG/ML IJ SOLN
INTRAMUSCULAR | Status: AC | PRN
  Administered 2022-02-27: 10 mg via INTRAVENOUS
  Administered 2022-02-27: 40 mg via INTRAVENOUS

## 2022-02-27 MED ORDER — HYDROMORPHONE HCL 1 MG/ML IJ SOLN
0.5000 mg | Freq: Once | INTRAMUSCULAR | Status: AC
  Administered 2022-02-27: 0.5 mg via INTRAVENOUS
  Filled 2022-02-27: qty 1

## 2022-02-27 MED ORDER — ACETAMINOPHEN 500 MG PO TABS: 1000.0000 mg | ORAL_TABLET | Freq: Three times a day (TID) | ORAL | 0 refills | Status: AC | PRN

## 2022-02-27 MED ORDER — LIDOCAINE HCL (CARDIAC) PF 100 MG/5ML IV SOSY
PREFILLED_SYRINGE | INTRAVENOUS | Status: DC | PRN
  Administered 2022-02-27: 50 mg via INTRAVENOUS

## 2022-02-27 MED ORDER — PROPOFOL 10 MG/ML IV BOLUS
100.0000 mg | Freq: Once | INTRAVENOUS | Status: DC
  Filled 2022-02-27: qty 20

## 2022-02-27 MED ORDER — OXYCODONE HCL 5 MG PO TABS
5.0000 mg | ORAL_TABLET | Freq: Once | ORAL | Status: AC | PRN
  Administered 2022-02-27: 5 mg via ORAL

## 2022-02-27 MED ORDER — KETOROLAC TROMETHAMINE 30 MG/ML IJ SOLN: 30.0000 mg | Freq: Once | INTRAMUSCULAR | Status: DC

## 2022-02-27 MED ORDER — KETAMINE HCL 50 MG/5ML IJ SOSY
50.0000 mg | PREFILLED_SYRINGE | Freq: Once | INTRAMUSCULAR | Status: DC
  Filled 2022-02-27: qty 5

## 2022-02-27 MED ORDER — LACTATED RINGERS IV SOLN: INTRAVENOUS | Status: DC | PRN

## 2022-02-27 MED ORDER — FENTANYL CITRATE (PF) 250 MCG/5ML IJ SOLN
INTRAMUSCULAR | Status: DC | PRN
  Administered 2022-02-27: 100 ug via INTRAVENOUS

## 2022-02-27 MED ORDER — HYDROMORPHONE HCL 1 MG/ML IJ SOLN: 1.0000 mg | Freq: Once | INTRAMUSCULAR | Status: DC

## 2022-02-27 MED ORDER — ONDANSETRON HCL 4 MG/2ML IJ SOLN
INTRAMUSCULAR | Status: DC | PRN
  Administered 2022-02-27: 4 mg via INTRAVENOUS

## 2022-02-27 MED ORDER — AMISULPRIDE (ANTIEMETIC) 5 MG/2ML IV SOLN: 10.0000 mg | Freq: Once | INTRAVENOUS | Status: DC | PRN

## 2022-02-27 MED ORDER — METHOCARBAMOL 1000 MG/10ML IJ SOLN: 1000.0000 mg | Freq: Once | INTRAMUSCULAR | Status: DC

## 2022-02-27 MED ORDER — METHOCARBAMOL 1000 MG/10ML IJ SOLN
1000.0000 mg | Freq: Once | INTRAVENOUS | Status: DC
  Filled 2022-02-27: qty 10

## 2022-02-27 MED ORDER — TRAMADOL HCL 50 MG PO TABS: 50.0000 mg | ORAL_TABLET | Freq: Four times a day (QID) | ORAL | 0 refills | Status: AC | PRN

## 2022-02-27 MED ORDER — NAPROXEN 500 MG PO TABS: 500.0000 mg | ORAL_TABLET | Freq: Two times a day (BID) | ORAL | 0 refills | Status: AC

## 2022-02-27 MED ORDER — FENTANYL CITRATE (PF) 100 MCG/2ML IJ SOLN: 25.0000 ug | INTRAMUSCULAR | Status: DC | PRN

## 2022-02-27 MED ORDER — SUCCINYLCHOLINE 20MG/ML (10ML) SYRINGE FOR MEDFUSION PUMP - OPTIME
INTRAMUSCULAR | Status: DC | PRN
  Administered 2022-02-27: 80 mg via INTRAVENOUS

## 2022-02-27 MED ORDER — PROMETHAZINE HCL 25 MG/ML IJ SOLN: 6.2500 mg | INTRAMUSCULAR | Status: DC | PRN

## 2022-02-27 SURGICAL SUPPLY — 46 items
BAG COUNTER SPONGE SURGICOUNT (BAG) ×1 IMPLANT
BENZOIN TINCTURE PRP APPL 2/3 (GAUZE/BANDAGES/DRESSINGS) ×1 IMPLANT
BNDG COHESIVE 4X5 TAN STRL (GAUZE/BANDAGES/DRESSINGS) ×1 IMPLANT
BNDG ELASTIC 4X5.8 VLCR STR LF (GAUZE/BANDAGES/DRESSINGS) IMPLANT
BNDG ELASTIC 6X5.8 VLCR STR LF (GAUZE/BANDAGES/DRESSINGS) IMPLANT
COVER SURGICAL LIGHT HANDLE (MISCELLANEOUS) ×1 IMPLANT
DRAIN PENROSE 1/2X12 LTX STRL (WOUND CARE) IMPLANT
DRAPE C-ARM 42X72 X-RAY (DRAPES) IMPLANT
DRAPE IMP U-DRAPE 54X76 (DRAPES) ×1 IMPLANT
DRAPE U-SHAPE 47X51 STRL (DRAPES) ×1 IMPLANT
DURAPREP 26ML APPLICATOR (WOUND CARE) ×1 IMPLANT
ELECT REM PT RETURN 9FT ADLT (ELECTROSURGICAL)
ELECTRODE REM PT RTRN 9FT ADLT (ELECTROSURGICAL) ×1 IMPLANT
FACESHIELD WRAPAROUND (MASK) IMPLANT
FACESHIELD WRAPAROUND OR TEAM (MASK) ×1 IMPLANT
GAUZE PAD ABD 8X10 STRL (GAUZE/BANDAGES/DRESSINGS) IMPLANT
GAUZE SPONGE 4X4 12PLY STRL (GAUZE/BANDAGES/DRESSINGS) IMPLANT
GAUZE XEROFORM 5X9 LF (GAUZE/BANDAGES/DRESSINGS) IMPLANT
GLOVE BIOGEL PI IND STRL 8 (GLOVE) ×1 IMPLANT
GLOVE ECLIPSE 8.0 STRL XLNG CF (GLOVE) ×1 IMPLANT
GOWN STRL REUS W/ TWL LRG LVL3 (GOWN DISPOSABLE) ×2 IMPLANT
GOWN STRL REUS W/ TWL XL LVL3 (GOWN DISPOSABLE) ×1 IMPLANT
GOWN STRL REUS W/TWL LRG LVL3 (GOWN DISPOSABLE)
GOWN STRL REUS W/TWL XL LVL3 (GOWN DISPOSABLE)
KIT BASIN OR (CUSTOM PROCEDURE TRAY) ×1 IMPLANT
KIT TURNOVER KIT B (KITS) ×1 IMPLANT
MANIFOLD NEPTUNE II (INSTRUMENTS) ×1 IMPLANT
NS IRRIG 1000ML POUR BTL (IV SOLUTION) ×1 IMPLANT
PACK SHOULDER (CUSTOM PROCEDURE TRAY) ×1 IMPLANT
PACK UNIVERSAL I (CUSTOM PROCEDURE TRAY) ×1 IMPLANT
PAD ARMBOARD 7.5X6 YLW CONV (MISCELLANEOUS) ×2 IMPLANT
PAD CAST 4YDX4 CTTN HI CHSV (CAST SUPPLIES) IMPLANT
PADDING CAST COTTON 4X4 STRL (CAST SUPPLIES)
PENCIL BUTTON HOLSTER BLD 10FT (ELECTRODE) IMPLANT
SLING ARM FOAM STRAP LRG (SOFTGOODS) IMPLANT
SPONGE T-LAP 4X18 ~~LOC~~+RFID (SPONGE) ×2 IMPLANT
STAPLER VISISTAT 35W (STAPLE) IMPLANT
STOCKINETTE IMPERVIOUS 9X36 MD (GAUZE/BANDAGES/DRESSINGS) IMPLANT
SUCTION FRAZIER HANDLE 10FR (MISCELLANEOUS)
SUCTION TUBE FRAZIER 10FR DISP (MISCELLANEOUS) IMPLANT
SUT VIC AB 2-0 CTB1 (SUTURE) IMPLANT
TOWEL GREEN STERILE (TOWEL DISPOSABLE) ×1 IMPLANT
TOWEL GREEN STERILE FF (TOWEL DISPOSABLE) ×1 IMPLANT
TUBE CONNECTING 12X1/4 (SUCTIONS) IMPLANT
WATER STERILE IRR 1000ML POUR (IV SOLUTION) ×1 IMPLANT
YANKAUER SUCT BULB TIP NO VENT (SUCTIONS) IMPLANT

## 2022-02-27 NOTE — Sedation Documentation (Signed)
REDUCTION UNSUCCESSFUL, ORTHO TO CONSULT.

## 2022-02-27 NOTE — Anesthesia Postprocedure Evaluation (Signed)
Anesthesia Post Note  Patient: Shamel Germond  Procedure(s) Performed: CLOSED REDUCTION RIGHT SHOULDER (Right: Hip)     Patient location during evaluation: PACU Anesthesia Type: General Level of consciousness: awake Pain management: pain level controlled Vital Signs Assessment: post-procedure vital signs reviewed and stable Respiratory status: spontaneous breathing, nonlabored ventilation and respiratory function stable Cardiovascular status: blood pressure returned to baseline and stable Postop Assessment: no apparent nausea or vomiting Anesthetic complications: no   No notable events documented.  Last Vitals:  Vitals:   02/27/22 0400 02/27/22 0415  BP: (!) 148/87 (!) 145/98  Pulse: 63 (!) 58  Resp: 16 17  Temp:  36.9 C  SpO2: 95% 98%    Last Pain:  Vitals:   02/27/22 0345  TempSrc:   PainSc: 0-No pain                 Jhan Conery P Miho Monda

## 2022-02-27 NOTE — ED Notes (Signed)
Remaining propofol wasted with Pearletha Forge RN

## 2022-02-27 NOTE — ED Provider Notes (Signed)
[facility] EMERGENCY DEPARTMENT Provider Note  CSN: [id number] Arrival date & time: 02/26/22 2155  Chief Complaint(s) Shoulder Injury  HPI [name] is a 36 y.o. male here for right shoulder pain.  Patient reports ice-skating, losing his balance and falling backwards onto his right arm.  He felt immediate pain, which is severe throbbing/aching with cramping components.  Worse with range of motion and palpation.  Feels like his shoulder is out of place.  He denies any other injuries including head trauma or loss of consciousness.  He denies any headache, neck pain, back pain, chest pain, abdominal pain or other extremity pain.  The history is provided by the patient.    Past Medical History No past medical history on file. There are no problems to display for this patient.  Home Medication(s) Prior to Admission medications   Medication Sig Start Date End Date Taking? Authorizing Provider  amphetamine-dextroamphetamine (ADDERALL XR) 20 MG 24 hr capsule Take 20 mg by mouth daily. 02/17/22  Yes [provider]  naproxen (NAPROSYN) 500 MG tablet Take 1 tablet (500 mg total) by mouth 2 (two) times daily. Patient not taking: Reported on 02/26/2022 04/28/19   Dietrich Pates, PA-C  pantoprazole (PROTONIX) 20 MG tablet Take 1 tablet (20 mg total) by mouth daily. Patient not taking: Reported on 02/26/2022 04/16/19   Merrilee Jansky, MD                                                                                                                                    Allergies Natural vegetable orange [psyllium]  Review of Systems Review of Systems As noted in HPI  Physical Exam Vital Signs  I have reviewed the triage vital signs BP (!) 172/94   Pulse (!) 200   Temp 98.6 F (37 C) (Oral)   Resp 15   Ht 5\' 8"  (1.727 m)   Wt 84.8 kg   SpO2 97%   BMI 28.43 kg/m   Physical Exam Constitutional:      General: He is not in acute distress.    Appearance:  He is well-developed. He is not diaphoretic.  HENT:     Head: Normocephalic.     Right Ear: External ear normal.     Left Ear: External ear normal.  Eyes:     General: No scleral icterus.       Right eye: No discharge.        Left eye: No discharge.     Conjunctiva/sclera: Conjunctivae normal.     Pupils: Pupils are equal, round, and reactive to light.  Cardiovascular:     Rate and Rhythm: Regular rhythm.     Pulses:          Radial pulses are 2+ on the right side and 2+ on the left side.       Dorsalis pedis pulses are 2+ on the right side and 2+ on the  left side.     Heart sounds: Normal heart sounds. No murmur heard.    No friction rub. No gallop.  Pulmonary:     Effort: Pulmonary effort is normal. No respiratory distress.     Breath sounds: Normal breath sounds. No stridor.  Abdominal:     General: There is no distension.     Palpations: Abdomen is soft.     Tenderness: There is no abdominal tenderness.  Musculoskeletal:     Right shoulder: Deformity and tenderness present. No crepitus. Decreased range of motion. Normal strength. Normal pulse.     Cervical back: Normal range of motion and neck supple. No bony tenderness.     Thoracic back: No bony tenderness.     Lumbar back: No bony tenderness.     Comments: Clavicle stable. Chest stable to AP/Lat compression. Pelvis stable to Lat compression. No obvious extremity deformity. No chest or abdominal wall contusion.  Skin:    General: Skin is warm.  Neurological:     Mental Status: He is alert and oriented to person, place, and time.     GCS: GCS eye subscore is 4. GCS verbal subscore is 5. GCS motor subscore is 6.     Comments: Moving all extremities      ED Results and Treatments Labs (all labs ordered are listed, but only abnormal results are displayed) Labs Reviewed - No data to display                                                                                                                       EKG  EKG  Interpretation  Date/Time:    Ventricular Rate:    PR Interval:    QRS Duration:   QT Interval:    QTC Calculation:   R Axis:     Text Interpretation:         Radiology DG Shoulder Right  Result Date: 02/27/2022 CLINICAL DATA:  Post reduction attempted in the ED EXAM: RIGHT SHOULDER - 1 VIEW COMPARISON:  02/26/2022 10:39 p.m. FINDINGS: Redemonstrated anterior dislocation of the right humeral head with respect to the glenoid. Redemonstrated fracture of the greater tuberosity of the right humeral head. No new acute osseous abnormality. Diffuse soft tissue swelling. IMPRESSION: Redemonstrated anterior dislocation of the right humeral head with redemonstrated fracture of the greater tuberosity. Electronically Signed   By: Wiliam Ke M.D.   On: 02/27/2022 00:58   DG Shoulder Right  Result Date: 02/26/2022 CLINICAL DATA:  Status post fall. EXAM: RIGHT SHOULDER - 2+ VIEW COMPARISON:  None Available. FINDINGS: There is an acute, comminuted fracture deformity involving the greater tuberosity of the right humeral head. Anterior dislocation of the right humeral head is also seen with respect to the right glenoid. Diffuse soft tissue swelling is seen. IMPRESSION: Anterior dislocation of the right shoulder with an associated fracture of the greater tuberosity of the right humeral head. Electronically Signed   By: Aram Candela M.D.   On: 02/26/2022 22:53  Medications Ordered in ED Medications  propofol (DIPRIVAN) 10 mg/mL bolus/IV push 100 mg (100 mg Intravenous See Procedure Record 02/27/22 0100)  ketamine 50 mg in normal saline 5 mL (10 mg/mL) syringe (50 mg Intravenous See Procedure Record 02/27/22 0100)  HYDROmorphone (DILAUDID) injection 1 mg (has no administration in time range)  methocarbamol (ROBAXIN) 1,000 mg in dextrose 5 % 100 mL IVPB (has no administration in time range)  oxyCODONE-acetaminophen (PERCOCET/ROXICET) 5-325 MG per tablet 1 tablet (1 tablet Oral Given 02/26/22 2217)   diazepam (VALIUM) injection 5 mg (5 mg Intramuscular Given 02/26/22 2358)  ketamine (KETALAR) injection (10 mg Intravenous Given 02/27/22 0030)  propofol (DIPRIVAN) 10 mg/mL bolus/IV push (40 mg Intravenous Given 02/27/22 0041)  HYDROmorphone (DILAUDID) injection 0.5 mg (0.5 mg Intravenous Given 02/27/22 0130)                                                                                                                                     Procedures .Sedation  Date/Time: 02/27/2022 2:31 AM  Performed by: Nira Conn, MD Authorized by: Nira Conn, MD   Consent:    Consent obtained:  Written   Consent given by:  Patient   Risks discussed:  Prolonged hypoxia resulting in organ damage, prolonged sedation necessitating reversal, respiratory compromise necessitating ventilatory assistance and intubation, vomiting, nausea and allergic reaction Universal protocol:    Procedure explained and questions answered to patient or proxy's satisfaction: yes     Imaging studies available: yes     Immediately prior to procedure, a time out was called: yes   Indications:    Procedure performed:  Fracture reduction   Procedure necessitating sedation performed by:  Physician performing sedation Pre-sedation assessment:    Time since last food or drink:  2100   ASA classification: class 1 - normal, healthy patient     Mouth opening:  2 finger widths   Thyromental distance:  3 finger widths   Mallampati score:  II - soft palate, uvula, fauces visible   Neck mobility: normal     Pre-sedation assessments completed and reviewed: airway patency, cardiovascular function, hydration status, mental status, nausea/vomiting, pain level, respiratory function and temperature     Pre-sedation assessment completed:  02/27/2022 12:10 AM Immediate pre-procedure details:    Reassessment: Patient reassessed immediately prior to procedure     Reviewed: vital signs, relevant labs/tests and NPO  status     Verified: bag valve mask available, emergency equipment available, intubation equipment available, IV patency confirmed and oxygen available   Procedure details (see MAR for exact dosages):    Preoxygenation:  Nasal cannula   Sedation:  Propofol and ketamine   Intended level of sedation: deep   Intra-procedure monitoring:  Blood pressure monitoring, continuous capnometry, frequent LOC assessments, frequent vital sign checks, continuous pulse oximetry and cardiac monitor   Intra-procedure events: none     Total Provider sedation time (minutes):  20  Post-procedure details:    Attendance: Constant attendance by certified staff until patient recovered     Recovery: Patient returned to pre-procedure baseline     Post-sedation assessments completed and reviewed: airway patency, cardiovascular function, hydration status, mental status, nausea/vomiting, pain level, respiratory function and temperature     Patient is stable for discharge or admission: yes     Procedure completion:  Tolerated well, no immediate complications .Ortho Injury Treatment  Date/Time: 02/27/2022 2:34 AM  Performed by: Nira Connardama, Aracelly Tencza Eduardo, MD Authorized by: Nira Connardama, Bentley Fissel Eduardo, MD   Consent:    Consent obtained:  Written   Consent given by:  Patient   Risks discussed:  Fracture, nerve damage, recurrent dislocation, restricted joint movement and irreducible dislocation   Alternatives discussed:  Alternative treatment and delayed treatmentInjury location: shoulder Location details: right shoulder Injury type: fracture-dislocation Dislocation type: anterior Fracture type: greater humeral tuberosity Pre-procedure neurovascular assessment: neurovascularly intact Pre-procedure range of motion: reduced  Patient sedated: Yes. Refer to sedation procedure documentation for details of sedation. Manipulation performed: yes Skeletal traction used: yes Reduction successful: no Post-procedure neurovascular  assessment: post-procedure neurovascularly intact     (including critical care time)  Medical Decision Making / ED Course   Medical Decision Making Amount and/or Complexity of Data Reviewed Radiology: ordered and independent interpretation performed. Decision-making details documented in ED Course.  Risk Prescription drug management. Parenteral controlled substances. Drug therapy requiring intensive monitoring for toxicity. Emergency major surgery.    X-ray consistent with right shoulder dislocation.  Radiology noted fracture to the greater tuberosity. Patient is neurovascular intact distally. Patient sedated, but  defect reduction was not successful. Confirmed with Xray Ortho consulted, I spoke with Dr. Ofilia NeasMarshwiany who reviewed xrays and will plan to take patient to the OR to attempt reduction.  Patient provided with additional pain meds and muscle relaxers.        Final Clinical Impression(s) / ED Diagnoses Final diagnoses:  Closed fracture dislocation of joint of right shoulder girdle, initial encounter            This chart was dictated using voice recognition software.  Despite best efforts to proofread,  errors can occur which can change the documentation meaning.    Nira Connardama, Rollan Roger Eduardo, MD 02/27/22 [phone]

## 2022-02-27 NOTE — Op Note (Signed)
02/27/2022  3:25 AM  PATIENT:  [name]    PRE-OPERATIVE DIAGNOSIS: Right glenohumeral dislocation with greater tuberosity fracture  POST-OPERATIVE DIAGNOSIS:  Same  PROCEDURE: Closed reduction right glenohumeral dislocation  SURGEON:  Latrail Pounders A Amisadai Woodford, MD  PHYSICIAN ASSISTANT: none  ANESTHESIA:   General  PREOPERATIVE INDICATIONS:  [name] is a  36 y.o. male with a diagnosis of RIGHT SHOULDER DISLOCATION who failed conservative measures and elected for surgical management.    The risks benefits and alternatives were discussed with the patient preoperatively including but not limited to the risks of infection, bleeding, nerve injury, cardiopulmonary complications, the need for revision surgery, among others, and the patient was willing to proceed.  ESTIMATED BLOOD LOSS: 0cc  OPERATIVE IMPLANTS: none  OPERATIVE FINDINGS: successful closed reduction right shoulder  OPERATIVE PROCEDURE:  The patient was brought to the operating room.  He remained on the stretcher.  Anesthesia was given as well as short acting paralytic.  Timeout was called.  With the patient still on the streacher and a sheet around the shoulder for countertraction.  Traction was applied and the arm was slowly abducted externally rotated.  There was a visible and palpable clunk.  Fluoroscopy was brought in and demonstrated improved alignment of the glenohumeral joint.  Sling was applied to the right upper extremity.  Patient was awoken from anesthesia taken to the PACU in stable condition.  Post op recs: WB: NWB RUE in sling Abx: none Imaging: PACU xrays Dressing: none DVT prophylaxis: none Follow up: 1 week for follow up Address: [address], [place], [place] [address]  Office Phone: [phone]

## 2022-02-27 NOTE — Anesthesia Procedure Notes (Signed)
Procedure Name: General with mask airway Date/Time: 02/27/2022 3:12 AM  Performed by: Molli Hazard, CRNAPre-anesthesia Checklist: Patient identified, Emergency Drugs available, Suction available and Patient being monitored Patient Re-evaluated:Patient Re-evaluated prior to induction Oxygen Delivery Method: Circle system utilized Preoxygenation: Pre-oxygenation with 100% oxygen Induction Type: IV induction Ventilation: Mask ventilation without difficulty

## 2022-02-27 NOTE — Discharge Instructions (Signed)
Orthopedic Discharge Instructions  Diet: As you were doing prior to hospitalization     Activity:  Increase activity slowly as tolerated, but follow the weight bearing instructions below.  The rules on driving is that you can not be taking narcotics while you drive, and you must feel in control of the vehicle.    Weight Bearing:  non weight bearing in sling   To prevent constipation: you may use a stool softener such as -  Colace (over the counter) 100 mg by mouth twice a day  Drink plenty of fluids (prune juice may be helpful) and high fiber foods Miralax (over the counter) for constipation as needed.    Itching:  If you experience itching with your medications, try taking only a single pain pill, or even half a pain pill at a time.  You may take up to 10 pain pills per day, and you can also use benadryl over the counter for itching or also to help with sleep.   Precautions:  If you experience chest pain or shortness of breath - call 911 immediately for transfer to the hospital emergency department!!   Call office [phone]) for the following: Temperature greater than 101F Persistent nausea and vomiting Severe uncontrolled pain Redness, tenderness, or signs of infection (pain, swelling, redness, odor or green/yellow discharge around the site) Difficulty breathing, headache or visual disturbances Hives Persistent dizziness or light-headedness Extreme fatigue Any other questions or concerns you may have after discharge  In an emergency, call 911 or go to an Emergency Department at a nearby hospital  Follow- Up Appointment:  Please call for an appointment to be seen approximately 1 week after surgery in [facility] with your surgeon Dr. Weber Cooks - [phone] Address: [address], [place], [place] [address]

## 2022-02-27 NOTE — Transfer of Care (Signed)
Immediate Anesthesia Transfer of Care Note  Patient: [name]  Procedure(s) Performed: CLOSED REDUCTION RIGHT SHOULDER (Right: Hip)  Patient Location: PACU  Anesthesia Type:General  Level of Consciousness: awake, alert , and oriented  Airway & Oxygen Therapy: Patient Spontanous Breathing  Post-op Assessment: Report given to RN and Post -op Vital signs reviewed and stable  Post vital signs: Reviewed and stable  Last Vitals:  Vitals Value Taken Time  BP 148/95 02/27/22 0338  Temp    Pulse 67 02/27/22 0339  Resp 10 02/27/22 0339  SpO2 99 % 02/27/22 0339  Vitals shown include unvalidated device data.  Last Pain:  Vitals:   02/27/22 0129  TempSrc:   PainSc: 10-Worst pain ever         Complications: No notable events documented.

## 2022-02-27 NOTE — ED Notes (Signed)
Pt taken to short stay 36

## 2022-02-27 NOTE — Consult Note (Signed)
ORTHOPAEDIC CONSULTATION  REQUESTING PHYSICIAN: Cardama, Amadeo Garnet, *  Chief Complaint: Right shoulder injury  HPI: [name] is a 36 y.o. male who is right-hand dominant who fell while ice skating this evening around 9 PM.  He felt immediately like he had dislocated his shoulder he was brought to the emergency room where x-rays demonstrated a right glenohumeral shoulder fracture and dislocation.  Closed reduction time to the emergency room was unsuccessful.  He denies pain other joints or extremities he denies distal numbness and tingling.  No past medical history on file. No past surgical history on file. Social History   Socioeconomic History   Marital status: Single    Spouse name: Not on file   Number of children: Not on file   Years of education: Not on file   Highest education level: Not on file  Occupational History   Not on file  Tobacco Use   Smoking status: Some Days    Types: E-cigarettes   Smokeless tobacco: Never   Tobacco comments:    rare  Vaping Use   Vaping Use: Not on file  Substance and Sexual Activity   Alcohol use: Yes    Comment: occ   Drug use: No   Sexual activity: Not on file  Other Topics Concern   Not on file  Social History Narrative   Not on file   Social Determinants of Health   Financial Resource Strain: Not on file  Food Insecurity: Not on file  Transportation Needs: Not on file  Physical Activity: Not on file  Stress: Not on file  Social Connections: Not on file   Family History  Problem Relation Age of Onset   Healthy Mother    Healthy Father    Allergies  Allergen Reactions   Natural Vegetable Orange [Psyllium] Swelling     Positive ROS: All other systems have been reviewed and were otherwise negative with the exception of those mentioned in the HPI and as above.  Physical Exam: General: Alert, no acute distress Cardiovascular: No pedal edema Respiratory: No cyanosis, no use of accessory musculature Skin:  No lesions in the area of chief complaint Neurologic: Sensation intact distally Psychiatric: Patient is competent for consent with normal mood and affect  MUSCULOSKELETAL:  RUE No traumatic wounds, ecchymosis, or rash  Nontender No elbow or wrist effusion  Sens median, radial, ulnar intact, notes intact but decreased sensation over the axillary nerve distribution.  Axillary motor function deferred given known fracture and dislocation  Motor AIN, PIN, IO intact  Radial pulse 2+, No significant edema   IMAGING: X-rays demonstrate dislocated glenohumeral joint with small greater tuberosity fracture  Assessment: Active Problems:   * No active hospital problems. *   Right glenohumeral shoulder dislocation with greater tuberosity fracture  Plan: Discussed with this patient and brothers at bedside that he has a dislocation of the right shoulder with a small greater tuberosity fracture.  Given unsuccessful closed reduction in the emergency room we are taking him to the operating room for closed versus open reduction of the glenohumeral joint.  Discussed importance of timely reduction of a native joint dislocation.  Discussed that even if performing open reduction we will not plan for fixation of the greater tuberosity fracture.  Depending on postoperative alignment of the fracture may benefit from secondary surgery to address the fracture at a later date.  Plan for immobilization in a sling postoperatively.  Risks of recurrent instability, need for secondary surgery, bleeding, nerve injury if open reduction  is performed.  Consent was signed by myself and the patient.  Right shoulder was marked.    Joen Laura, MD  Contact information:   [facility] 7am-5pm epic message Dr. Blanchie Dessert, or call office for patient follow up: [phone] After hours and holidays please check [email or web address] for group call information for [facility]

## 2022-02-27 NOTE — ED Notes (Signed)
RT and Ortho notified of moderate sedation

## 2022-02-27 NOTE — Anesthesia Preprocedure Evaluation (Addendum)
Anesthesia Evaluation  Patient identified by MRN, date of birth, ID band Patient awake    Reviewed: Allergy & Precautions, NPO status , Patient's Chart, lab work & pertinent test results  Airway Mallampati: II  TM Distance: >3 FB Neck ROM: Full    Dental no notable dental hx.    Pulmonary    Pulmonary exam normal        Cardiovascular Normal cardiovascular exam     Neuro/Psych    GI/Hepatic   Endo/Other    Renal/GU      Musculoskeletal   Abdominal   Peds  Hematology   Anesthesia Other Findings RIGHT SHOULDER DISLOCATION  Reproductive/Obstetrics                             Anesthesia Physical Anesthesia Plan  ASA: 1 and emergent  Anesthesia Plan: General   Post-op Pain Management:    Induction: Intravenous  PONV Risk Score and Plan: 2 and Ondansetron, Dexamethasone and Treatment may vary due to age or medical condition  Airway Management Planned: Mask  Additional Equipment:   Intra-op Plan:   Post-operative Plan:   Informed Consent: I have reviewed the patients History and Physical, chart, labs and discussed the procedure including the risks, benefits and alternatives for the proposed anesthesia with the patient or authorized representative who has indicated his/her understanding and acceptance.     Dental advisory given  Plan Discussed with: CRNA  Anesthesia Plan Comments:         Anesthesia Quick Evaluation

## 2022-03-01 ENCOUNTER — Other Ambulatory Visit: Payer: Self-pay | Admitting: Orthopaedic Surgery

## 2022-03-01 ENCOUNTER — Ambulatory Visit
Admission: RE | Admit: 2022-03-01 | Discharge: 2022-03-01 | Disposition: A | Discharge status: Home / Self Care | Payer: Managed Care, Other (non HMO) | Source: Ambulatory Visit | Attending: Orthopaedic Surgery

## 2022-03-01 ENCOUNTER — Ambulatory Visit
Admission: RE | Admit: 2022-03-01 | Discharge: 2022-03-01 | Disposition: A | Discharge status: Home / Self Care | Payer: Managed Care, Other (non HMO) | Source: Ambulatory Visit | Attending: Orthopaedic Surgery | Admitting: Orthopaedic Surgery

## 2022-03-01 DIAGNOSIS — S43004A Unspecified dislocation of right shoulder joint, initial encounter: Secondary | ICD-10-CM

## 2022-04-25 ENCOUNTER — Other Ambulatory Visit: Payer: Self-pay

## 2022-04-25 ENCOUNTER — Ambulatory Visit (HOSPITAL_COMMUNITY)
Admission: EM | Admit: 2022-04-25 | Discharge: 2022-04-25 | Disposition: A | Discharge status: Home / Self Care | Payer: 59

## 2022-04-25 ENCOUNTER — Encounter (HOSPITAL_COMMUNITY): Payer: Self-pay | Admitting: *Deleted

## 2022-04-25 DIAGNOSIS — R42 Dizziness and giddiness: Secondary | ICD-10-CM

## 2022-04-25 DIAGNOSIS — E860 Dehydration: Secondary | ICD-10-CM

## 2022-04-25 DIAGNOSIS — R030 Elevated blood-pressure reading, without diagnosis of hypertension: Secondary | ICD-10-CM | POA: Diagnosis not present

## 2022-04-25 DIAGNOSIS — H6123 Impacted cerumen, bilateral: Secondary | ICD-10-CM

## 2022-04-25 NOTE — ED Provider Notes (Signed)
[facility]    CSN: [id number] Arrival date & time: 04/25/22  1341      History   Chief Complaint Chief Complaint  Patient presents with   Hypertension   Dizziness    HPI [name] is a 37 y.o. male.   Patient presents to urgent care for evaluation after he noticed elevated blood pressure readings in the mornings for the last couple of days. 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 2 nights ago, he had 2 shots of tequila and woke up the next morning feeling "off". He checked his BP at home via wrist monitor and it was elevated in the 150s/100s. He felt better throughout the day and went back to work yesterday night where he had another shot of liquor while at work. This morning BP was elevated in the 140s/90s again. He is a former cigarette smoker and he now vapes with nicotine. No other drug use reported. After seeing elevated BP yesterday morning, he threw away his vape and abruptly quit using nicotine. He has not had nicotine in 24 hours and is currently experiencing some dizziness. He cannot identify any triggering or relieving factors for dizziness, although he states his brother felt the same way when he stopped vaping abruptly. Patient states he doesn't drink very much water at baseline. Denies urinary symptoms, fever/chills, viral URI symptoms, chest pain, shortness of breath, heart palpitations, weakness, vision changes, eye pain, headache, and recent falls. He has not attempted use of any over the counter medicines prior to arrival at urgent care. Denies history of high blood pressure.    Hypertension  Dizziness   History reviewed. No pertinent past medical history.  There are no problems to display for this patient.   Past Surgical History:  Procedure Laterality Date   HIP CLOSED REDUCTION Right 02/27/2022   Procedure: CLOSED REDUCTION RIGHT SHOULDER;  Surgeon: Joen Laura, MD;  Location: [facility];   Service: Orthopedics;  Laterality: Right;       Home Medications    Prior to Admission medications   Medication Sig Start Date End Date Taking? Authorizing Provider  amphetamine-dextroamphetamine (ADDERALL XR) 20 MG 24 hr capsule Take 20 mg by mouth daily. 02/17/22  Yes [provider]  naproxen (NAPROSYN) 500 MG tablet Take 1 tablet (500 mg total) by mouth 2 (two) times daily. Patient not taking: Reported on 02/26/2022 04/28/19   Dietrich Pates, PA-C  pantoprazole (PROTONIX) 20 MG tablet Take 1 tablet (20 mg total) by mouth daily. Patient not taking: Reported on 02/26/2022 04/16/19   Lamptey, Britta Mccreedy, MD    Family History Family History  Problem Relation Age of Onset   Healthy Mother    Healthy Father     Social History Social History   Tobacco Use   Smoking status: Some Days    Types: E-cigarettes   Smokeless tobacco: Never   Tobacco comments:    rare  Substance Use Topics   Alcohol use: Yes    Comment: occ   Drug use: No     Allergies   Natural vegetable orange [psyllium]   Review of Systems Review of Systems  Neurological:  Positive for dizziness.  Per HPI   Physical Exam Triage Vital Signs ED Triage Vitals  Enc Vitals Group     BP 04/25/22 1604 (!) 130/91     Pulse Rate 04/25/22 1604 72     Resp 04/25/22 1604 18     Temp 04/25/22  1604 98 F (36.7 C)     Temp src --      SpO2 04/25/22 1604 97 %     Weight --      Height --      Head Circumference --      Peak Flow --      Pain Score 04/25/22 1602 0     Pain Loc --      Pain Edu? --      Excl. in [place]? --    No data found.  Updated Vital Signs BP (!) 130/91   Pulse 72   Temp 98 F (36.7 C)   Resp 18   SpO2 97%   Visual Acuity Right Eye Distance:   Left Eye Distance:   Bilateral Distance:    Right Eye Near:   Left Eye Near:    Bilateral Near:     Physical Exam Vitals and nursing note reviewed.  Constitutional:      Appearance: He is not ill-appearing or toxic-appearing.   HENT:     Head: Normocephalic and atraumatic.     Right Ear: Hearing, tympanic membrane, ear canal and external ear normal. There is impacted cerumen.     Left Ear: Hearing, tympanic membrane, ear canal and external ear normal. There is impacted cerumen.     Nose: Nose normal.     Mouth/Throat:     Lips: Pink.     Mouth: Mucous membranes are moist.     Pharynx: No posterior oropharyngeal erythema.  Eyes:     General: Lids are normal. Vision grossly intact. Gaze aligned appropriately.     Extraocular Movements: Extraocular movements intact.     Conjunctiva/sclera: Conjunctivae normal.  Cardiovascular:     Rate and Rhythm: Normal rate and regular rhythm.     Heart sounds: Normal heart sounds, S1 normal and S2 normal.  Pulmonary:     Effort: Pulmonary effort is normal. No respiratory distress.     Breath sounds: Normal breath sounds and air entry.  Abdominal:     General: Abdomen is flat. Bowel sounds are normal.     Palpations: Abdomen is soft.  Musculoskeletal:     Cervical back: Neck supple.  Skin:    General: Skin is warm and dry.     Capillary Refill: Capillary refill takes less than 2 seconds.     Findings: No rash.  Neurological:     General: No focal deficit present.     Mental Status: He is alert and oriented to person, place, and time. Mental status is at baseline.     Cranial Nerves: Cranial nerves 2-12 are intact. No dysarthria or facial asymmetry.     Sensory: Sensation is intact.     Motor: Motor function is intact.     Coordination: Coordination is intact.     Gait: Gait is intact.     Comments: Nonfocal neuroexam.  Moves all 4 extremities voluntarily with normal coordination.  Psychiatric:        Mood and Affect: Mood normal.        Speech: Speech normal.        Behavior: Behavior normal.        Thought Content: Thought content normal.        Judgment: Judgment normal.      UC Treatments / Results  Labs (all labs ordered are listed, but only abnormal  results are displayed) Labs Reviewed - No data to display  EKG   Radiology No results found.  Procedures Procedures (including critical care time)  Medications Ordered in UC Medications - No data to display  Initial Impression / Assessment and Plan / UC Course  I have reviewed the triage vital signs and the nursing notes.  Pertinent labs & imaging results that were available during my care of the patient were reviewed by me and considered in my medical decision making (see chart for details).   1. Elevated BP reading without diagnosis of HTN, dizziness BP stable in clinic, but elevated at 130/91. He is asymptomatic and nontoxic in appearance. Dizziness likely related to recent cessation of nicotine use as well as decreased water intake and alcohol intake. Low suspicion for vertigo etiology. Patient to increase fluid intake to at least 64 ounces of water per day to stay well hydrated and avoid drinking alcohol as this is probably contributing to some of his symptoms.  Discussed DASH diet and reduced salt intake/increase exercise to naturally lower blood pressure.  Encouraged patient to establish care with PCP.  PCP assistance initiated today.  He was given the [facility] self scheduling website as well so that he is able to establish care with a PCP for ongoing health maintenance and prevention.  2. Bilateral impacted cerumen Both ears are impacted with cerumen.  Offered to clean out ears in clinic today with ear lavage by nursing staff, patient declines.  He would rather use the over-the-counter at home treatment first and come back to the clinic should he be unsuccessful in removing earwax from ears via Debrox eardrops and over-the-counter ear flushes.  Advised to avoid using Q-tips as this will make the infection worse.  Discussed physical exam and available lab work findings in clinic with patient.  Counseled patient regarding appropriate use of medications and potential side effects  for all medications recommended or prescribed today. Discussed red flag signs and symptoms of worsening condition,when to call the PCP office, return to urgent care, and when to seek higher level of care in the emergency department. Patient verbalizes understanding and agreement with plan. All questions answered. Patient discharged in stable condition.    Final Clinical Impressions(s) / UC Diagnoses   Final diagnoses:  Elevated blood pressure reading in office without diagnosis of hypertension  Dizziness  Dehydration  Bilateral impacted cerumen     Discharge Instructions      Your blood pressure is elevated in the clinic.   Lower the amount of salt in your diet and increase exercise to naturally lower BP. Review information provided regarding high blood pressure.  To find a primary care provider go to [email or web address] and follow the prompts to schedule a new patient appointment for primary care.  Someone from [facility] health will be reaching out to you either via MyChart or phone call to help you set up a primary care provider appointment as well.  It is very important to have a primary care provider to manage your overall health and prevent/manage chronic medical conditions.   You may use over the counter "Debrox" ear drops to help clean out your ears- use as directed.  Schedule an appointment with primary care provider for ongoing management of high blood pressure and for routine healthcare screenings.  Please go to the ER if you develop any severe symptoms such as chest pain, sudden shortness of breath, or one-sided weakness. I hope you feel better!      ED Prescriptions   None    PDMP not reviewed this encounter.   Talbot Grumbling, [place] 04/25/22 1736

## 2022-04-25 NOTE — Discharge Instructions (Signed)
Your blood pressure is elevated in the clinic.   Lower the amount of salt in your diet and increase exercise to naturally lower BP. Review information provided regarding high blood pressure.  To find a primary care provider go to [email or web address] and follow the prompts to schedule a new patient appointment for primary care.  Someone from [facility] will be reaching out to you either via MyChart or phone call to help you set up a primary care provider appointment as well.  It is very important to have a primary care provider to manage your overall health and prevent/manage chronic medical conditions.   You may use over the counter "Debrox" ear drops to help clean out your ears- use as directed.  Schedule an appointment with primary care provider for ongoing management of high blood pressure and for routine healthcare screenings.  Please go to the ER if you develop any severe symptoms such as chest pain, sudden shortness of breath, or one-sided weakness. I hope you feel better!

## 2022-04-25 NOTE — ED Triage Notes (Addendum)
BP at home 156/109 . 145/96. Pt also reports he felt dizzy . Pt reports he vaps daily . Pt also takes Adderall but has not taken it this weekend.

## 2022-05-02 ENCOUNTER — Emergency Department (HOSPITAL_COMMUNITY)
Admission: EM | Admit: 2022-05-02 | Discharge: 2022-05-03 | Discharge status: Against Medical Advice | Payer: 59 | Attending: Student | Admitting: Student

## 2022-05-02 ENCOUNTER — Emergency Department (HOSPITAL_COMMUNITY): Payer: 59

## 2022-05-02 ENCOUNTER — Other Ambulatory Visit: Payer: Self-pay

## 2022-05-02 DIAGNOSIS — Z5321 Procedure and treatment not carried out due to patient leaving prior to being seen by health care provider: Secondary | ICD-10-CM | POA: Diagnosis not present

## 2022-05-02 DIAGNOSIS — I10 Essential (primary) hypertension: Secondary | ICD-10-CM | POA: Diagnosis not present

## 2022-05-02 DIAGNOSIS — R42 Dizziness and giddiness: Secondary | ICD-10-CM | POA: Diagnosis present

## 2022-05-02 LAB — CBC WITH DIFFERENTIAL/PLATELET
Abs Immature Granulocytes: 0.01 10*3/uL (ref 0.00–0.07)
Basophils Absolute: 0.1 10*3/uL (ref 0.0–0.1)
Basophils Relative: 1 %
Eosinophils Absolute: 0.2 10*3/uL (ref 0.0–0.5)
Eosinophils Relative: 2 %
HCT: 42.2 % (ref 39.0–52.0)
Hemoglobin: 13.7 g/dL (ref 13.0–17.0)
Immature Granulocytes: 0 %
Lymphocytes Relative: 55 %
Lymphs Abs: 3.6 10*3/uL (ref 0.7–4.0)
MCH: 30 pg (ref 26.0–34.0)
MCHC: 32.5 g/dL (ref 30.0–36.0)
MCV: 92.3 fL (ref 80.0–100.0)
Monocytes Absolute: 0.6 10*3/uL (ref 0.1–1.0)
Monocytes Relative: 9 %
Neutro Abs: 2.2 10*3/uL (ref 1.7–7.7)
Neutrophils Relative %: 33 %
Platelets: 310 10*3/uL (ref 150–400)
RBC: 4.57 MIL/uL (ref 4.22–5.81)
RDW: 12.8 % (ref 11.5–15.5)
WBC: 6.7 10*3/uL (ref 4.0–10.5)
nRBC: 0 % (ref 0.0–0.2)

## 2022-05-02 NOTE — ED Triage Notes (Addendum)
Patient reports BP= 178/113 using a portable electronic blood pressure gadget this evening at home , he adds mild dizziness/lightheaded with left eye discomfort. No vision loss.

## 2022-05-02 NOTE — ED Provider Triage Note (Signed)
Emergency Medicine Provider Triage Evaluation Note  [name] , a 37 y.o. male  was evaluated in triage.  Pt complains of chest pain, lightheadedness and disequilibrium, pain behind the left eye in context of elevated BP to 170s/110s at home. No hx of same. Dizziness ongoing x 1 week.   Review of Systems  Positive: As above Negative: As above  Physical Exam  BP 131/89   Pulse 68   Temp 97.8 F (36.6 C) (Oral)   Resp 17   SpO2 100%  Gen:   Awake, no distress   Resp:  Normal effort  MSK:   Moves extremities without difficulty  Other:  RRR no m/r/g, lungs CTAB  Medical Decision Making  Medically screening exam initiated at 10:32 PM.  Appropriate orders placed.  [name] was informed that the remainder of the evaluation will be completed by another provider, this initial triage assessment does not replace that evaluation, and the importance of remaining in the ED until their evaluation is complete.  This chart was dictated using voice recognition software, Dragon. Despite the best efforts of this provider to proofread and correct errors, errors may still occur which can change documentation meaning.     Melvin Darling, PA-C 05/02/22 2241

## 2022-05-03 LAB — COMPREHENSIVE METABOLIC PANEL
ALT: 39 U/L (ref 0–44)
AST: 31 U/L (ref 15–41)
Albumin: 4 g/dL (ref 3.5–5.0)
Alkaline Phosphatase: 73 U/L (ref 38–126)
Anion gap: 11 (ref 5–15)
BUN: 11 mg/dL (ref 6–20)
CO2: 25 mmol/L (ref 22–32)
Calcium: 9.5 mg/dL (ref 8.9–10.3)
Chloride: 100 mmol/L (ref 98–111)
Creatinine, Ser: 1.09 mg/dL (ref 0.61–1.24)
GFR, Estimated: >60 mL/min (ref >60)
Glucose, Bld: 100 mg/dL — ABNORMAL HIGH (ref 70–99)
Potassium: 3.9 mmol/L (ref 3.5–5.1)
Sodium: 136 mmol/L (ref 135–145)
Total Bilirubin: 0.3 mg/dL (ref 0.3–1.2)
Total Protein: 6.9 g/dL (ref 6.5–8.1)

## 2022-05-03 LAB — TROPONIN I (HIGH SENSITIVITY)
Troponin I (High Sensitivity): 5 ng/L (ref <18)
Troponin I (High Sensitivity): 6 ng/L (ref <18)

## 2022-05-03 LAB — URINALYSIS, ROUTINE W REFLEX MICROSCOPIC
Bacteria, UA: NONE SEEN
Bilirubin Urine: NEGATIVE
Glucose, UA: NEGATIVE mg/dL
Ketones, ur: NEGATIVE mg/dL
Leukocytes,Ua: NEGATIVE
Nitrite: NEGATIVE
Protein, ur: NEGATIVE mg/dL
Specific Gravity, Urine: 1.005 (ref 1.005–1.030)
pH: 5 (ref 5.0–8.0)

## 2022-05-03 NOTE — ED Notes (Signed)
Patient stated that he was leaving, he stated he didn't want to stay any longer.

## 2022-06-22 IMAGING — CT CT RENAL STONE PROTOCOL
2 of 4 series · 15 of 46 positions shown, 17 images · non-contrast
Comparison: Scrotal ultrasound with Doppler earlier today. CT
Chest, Abdomen, and Pelvis 12/17/2006.

CLINICAL DATA: 34-year-old male with right flank, testicular pain
and hematuria since 2222 hours.

EXAM:
CT ABDOMEN AND PELVIS WITHOUT CONTRAST
TECHNIQUE: Multidetector CT imaging of the abdomen and pelvis was performed
following the standard protocol without IV contrast.

[Series 3: ap without · axial · non-contrast · 0.70mm/px · z∈[-590,-145]mm · 12 of 99 slices shown, 14 images]
[im 5/99  soft-tissue]
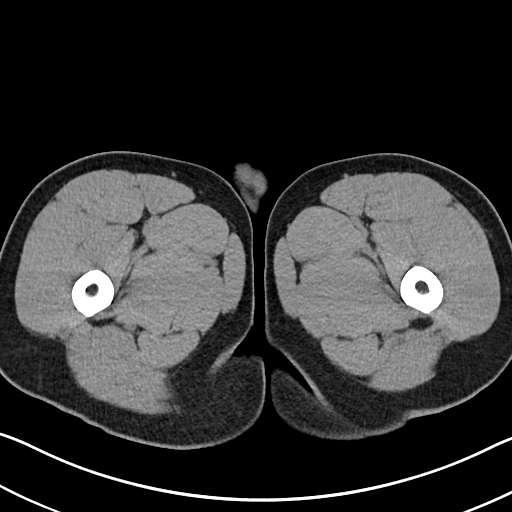
[im 5/99  bone]
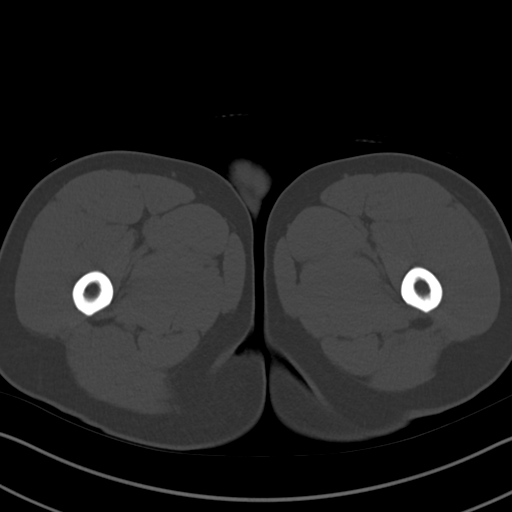
[im 15/99  soft-tissue]
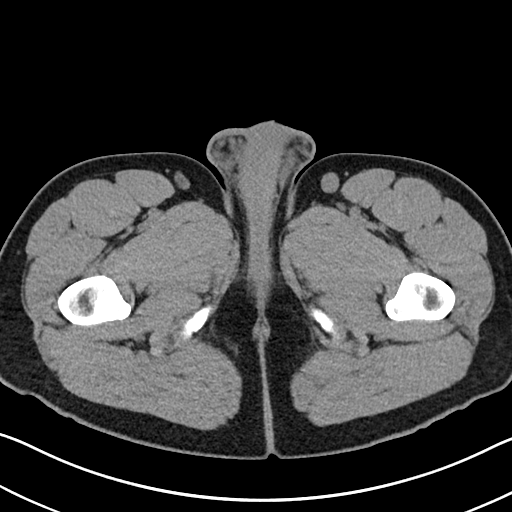
[im 20/99  soft-tissue]
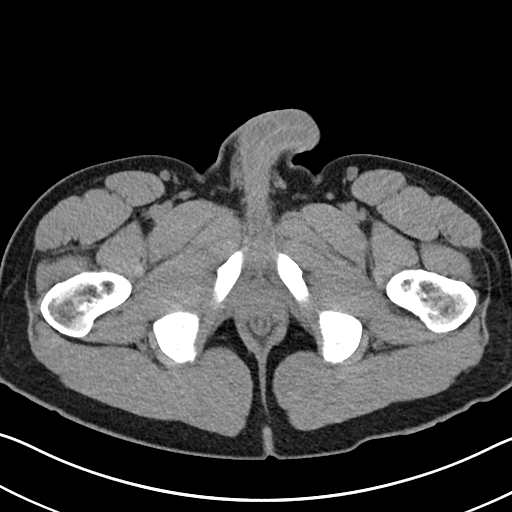
[im 30/99  soft-tissue]
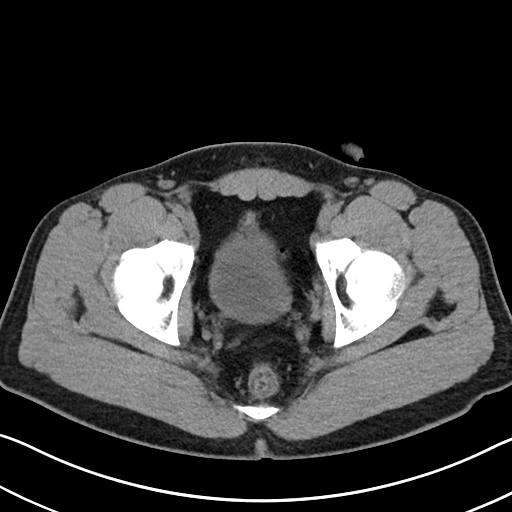
[im 40/99  soft-tissue]
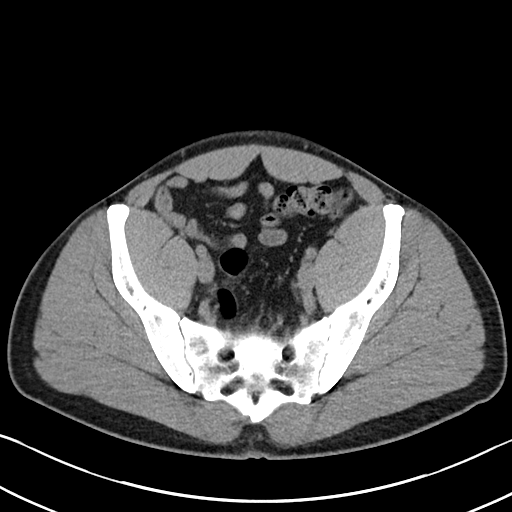
[im 45/99  soft-tissue]
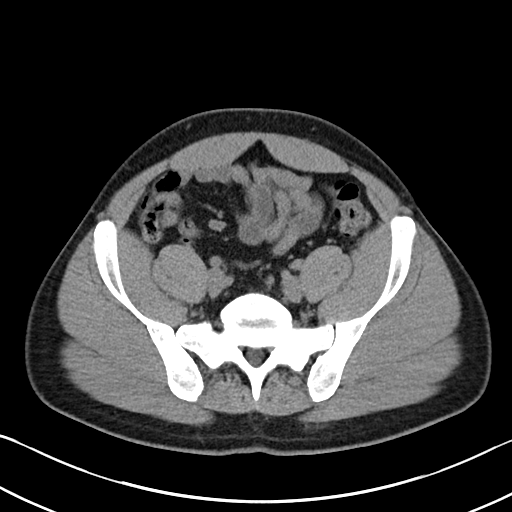
[im 54/99  soft-tissue]
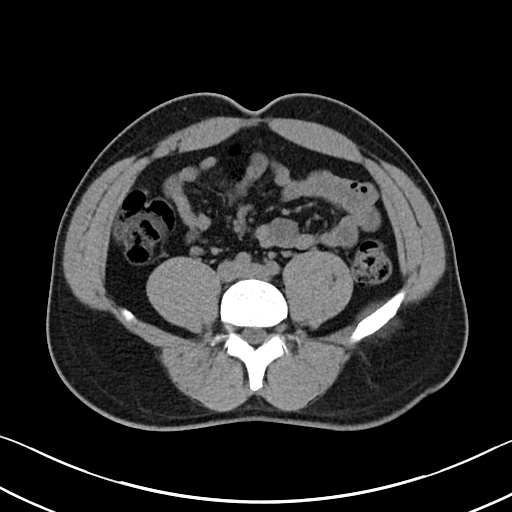
[im 59/99  soft-tissue]
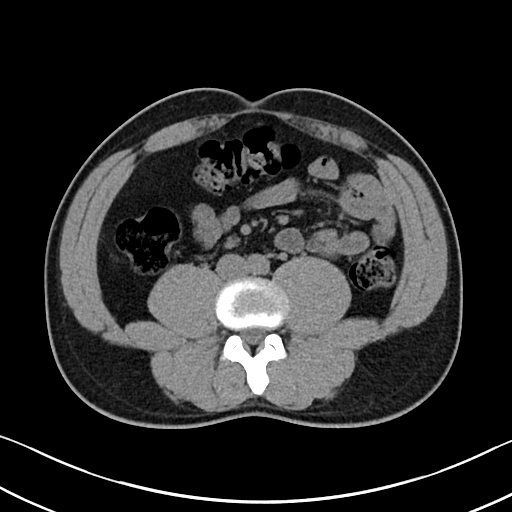
[im 69/99  soft-tissue]
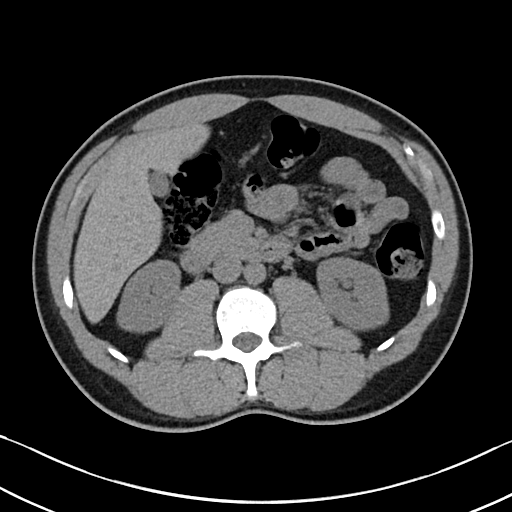
[im 69/99  bone]
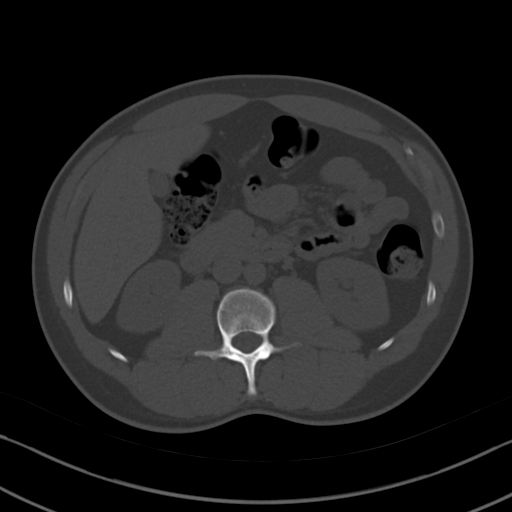
[im 79/99  soft-tissue]
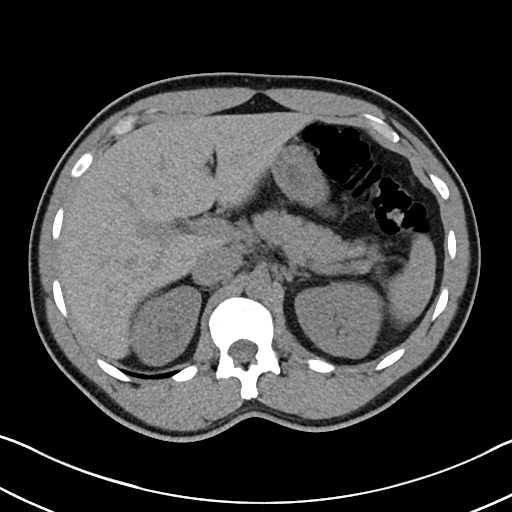
[im 84/99  soft-tissue]
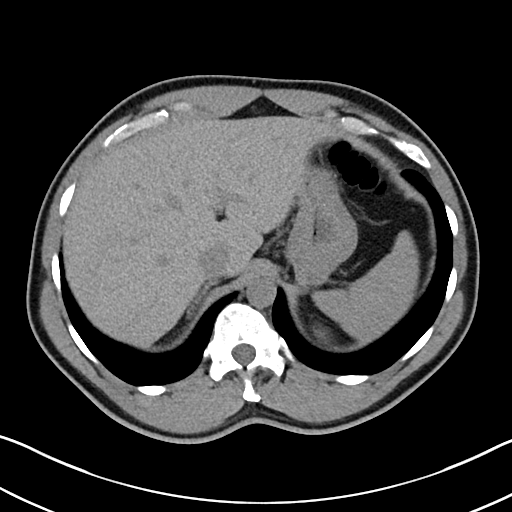
[im 94/99  soft-tissue]
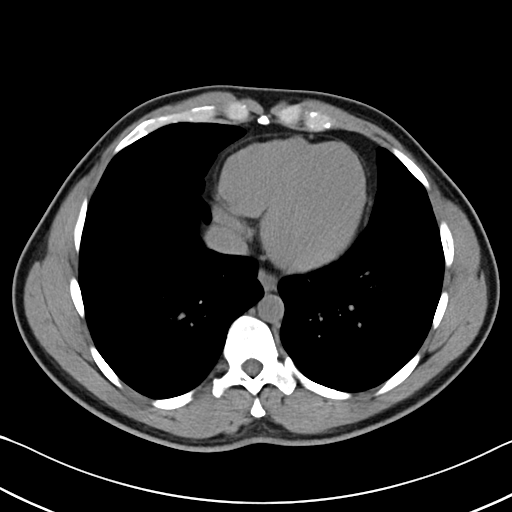

[Series 6: cor · coronal · 0.67mm/px · 3 of 92 slices shown]
[im 31/92  soft-tissue]
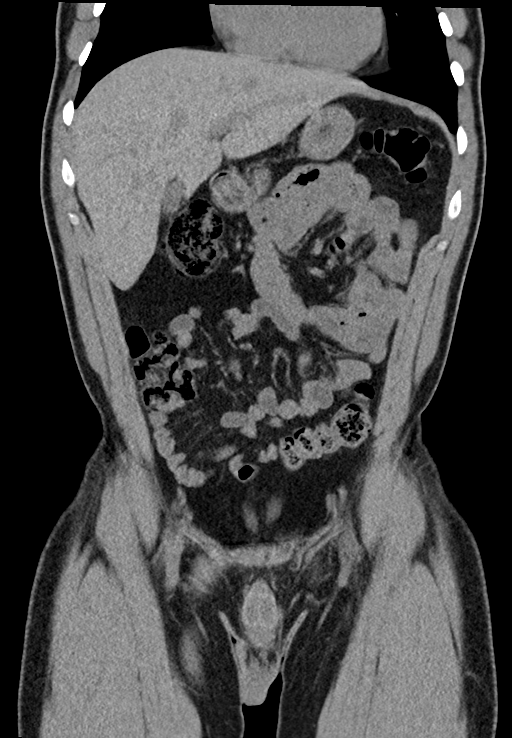
[im 41/92  soft-tissue]
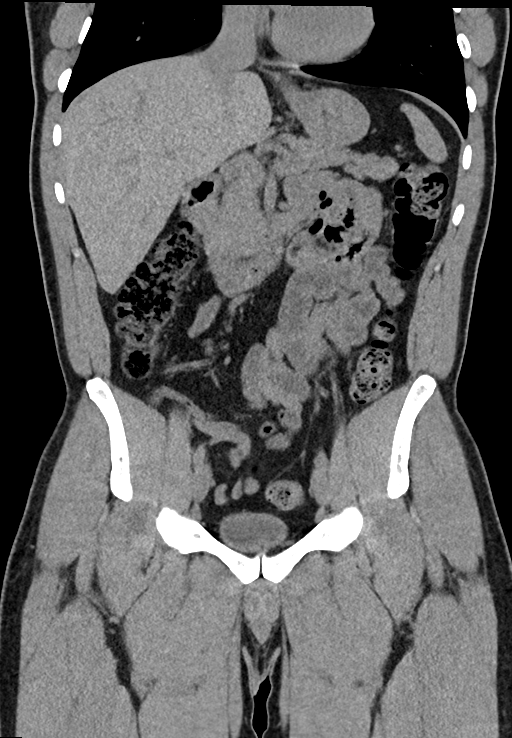
[im 51/92  soft-tissue]
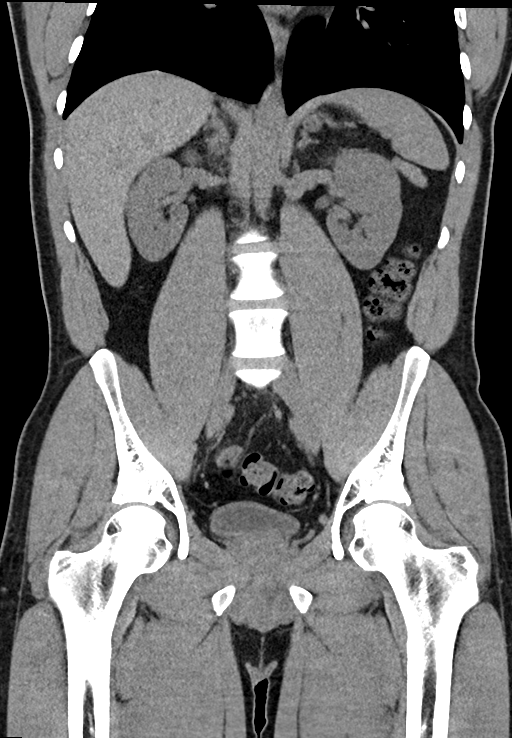

[15 of 46 positions shown; findings below may reference images not displayed]

FINDINGS: Lower chest: Negative.  No pericardial or pleural effusion.

Hepatobiliary: Negative noncontrast liver and gallbladder.

Pancreas: Negative.

Spleen: Negative.

Adrenals/Urinary Tract: Normal adrenal glands.

Noncontrast kidneys appear symmetric and normal. No hydronephrosis
or perinephric stranding. No nephrolithiasis. Ureters appear
symmetric and normal. The distal ureters are decompressed.
Diminutive and unremarkable urinary bladder. No urinary calculus
identified.

Stomach/Bowel: Negative large bowel aside from some redundancy and
retained stool. Normal appendix arising from the cecum on series 3,
image 54, is elongated and continues to the midline. Negative
terminal ileum. No dilated small bowel. Decompressed stomach and
duodenum. No free air, free fluid, mesenteric stranding.

Vascular/Lymphatic: Normal caliber abdominal aorta. No calcified
atherosclerosis. Vascular patency is not evaluated in the absence of
IV contrast. No lymphadenopathy.

Reproductive: Negative; punctate dystrophic calcification of the
right testis incidentally noted (inconsequential).

Other: No pelvic free fluid.

Musculoskeletal: Minor dextroconvex scoliosis, otherwise negative.
IMPRESSION: Negative noncontrast CT Abdomen and Pelvis.
No urinary calculus or explanation for flank pain.  Normal appendix.

## 2022-06-22 IMAGING — US US SCROTUM W/ DOPPLER COMPLETE
1 series · 14 of 25 positions shown · non-contrast
Comparison: None.

CLINICAL DATA: 34-year-old male with right testicular pain since
2422 hours.

EXAM:
SCROTAL ULTRASOUND
DOPPLER ULTRASOUND OF THE TESTICLES
TECHNIQUE: Complete ultrasound examination of the testicles, epididymis, and
other scrotal structures was performed. Color and spectral Doppler
ultrasound were also utilized to evaluate blood flow to the
testicles.

[Series 1: us scrotum w/doppler · 14 of 46 slices shown]
[im 1/46]
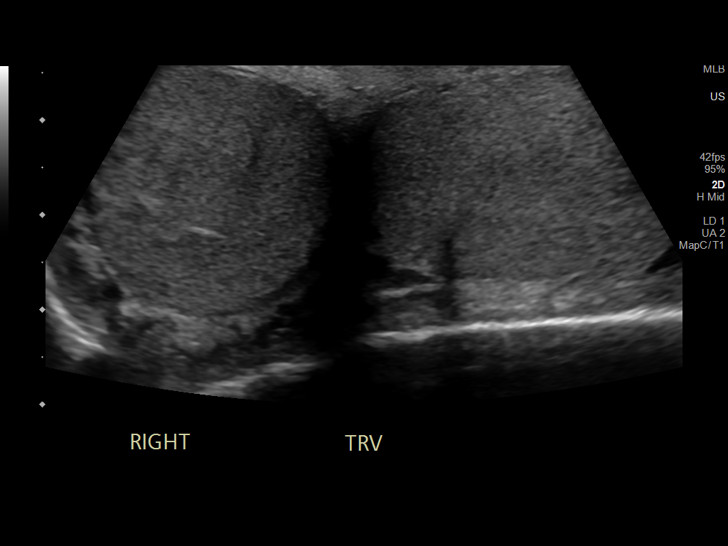
[im 4/46]
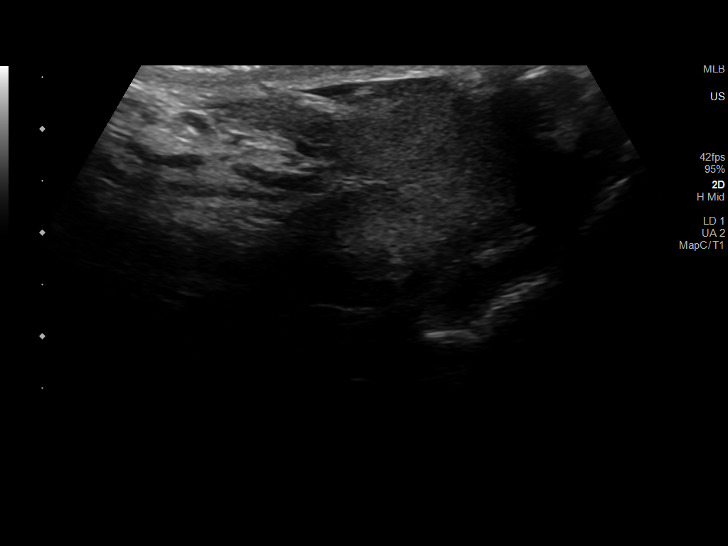
[im 8/46]
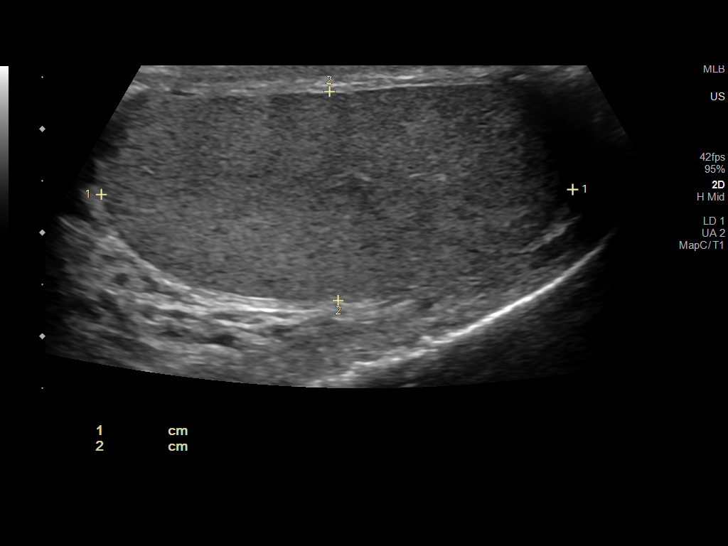
[im 12/46]
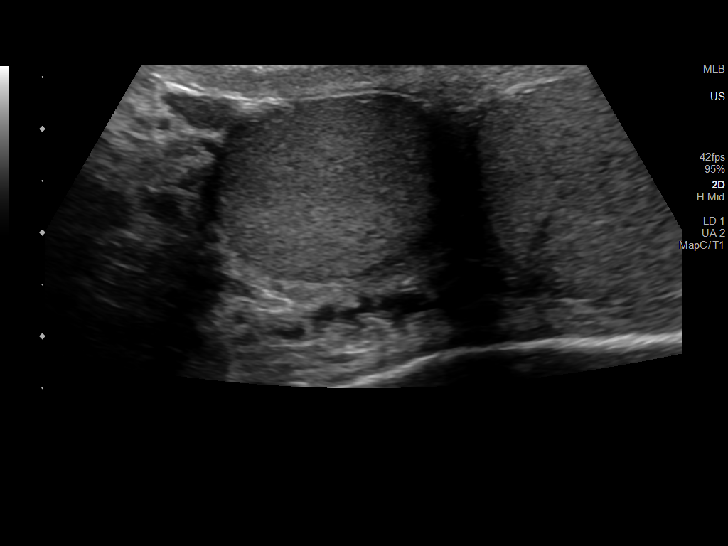
[im 16/46]
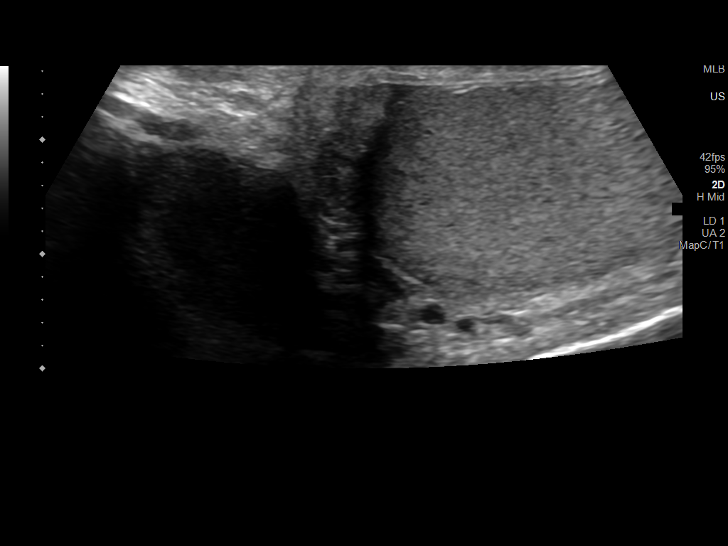
[im 17/46]
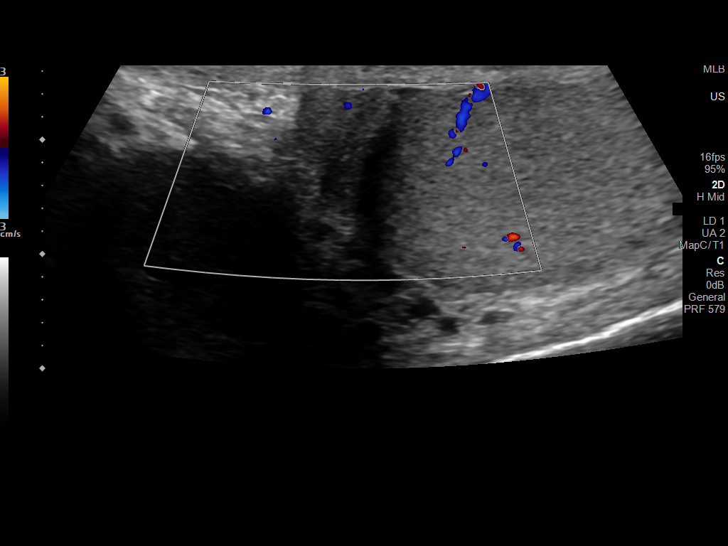
[im 21/46]
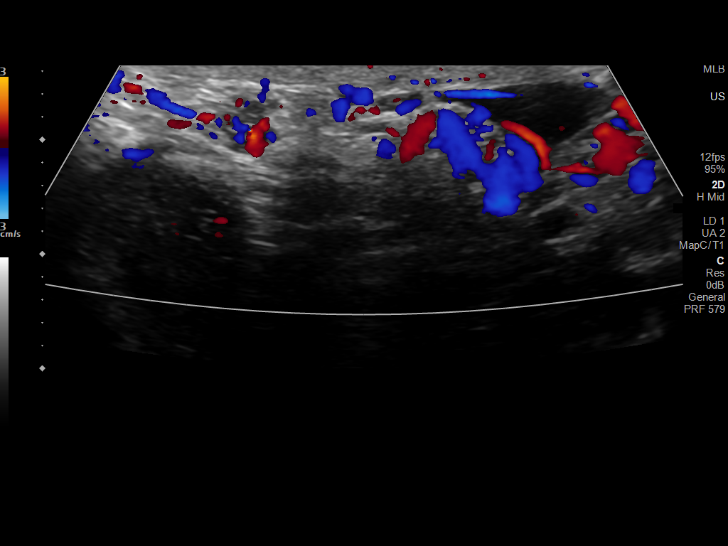
[im 25/46]
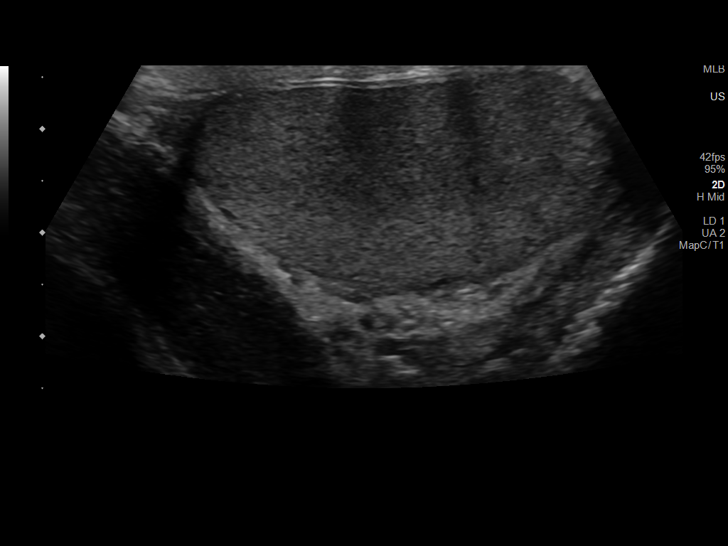
[im 29/46]
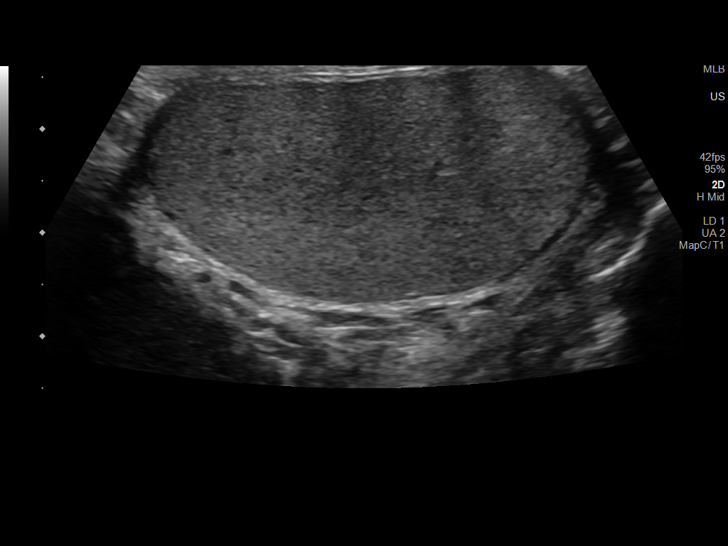
[im 31/46]
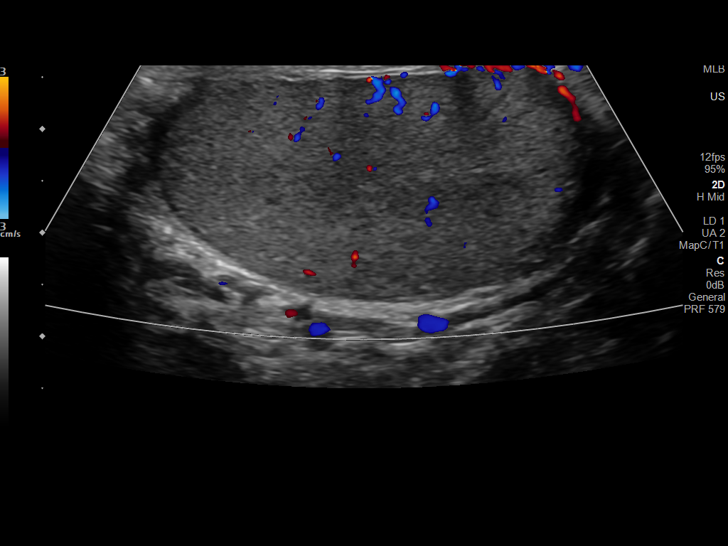
[im 34/46]
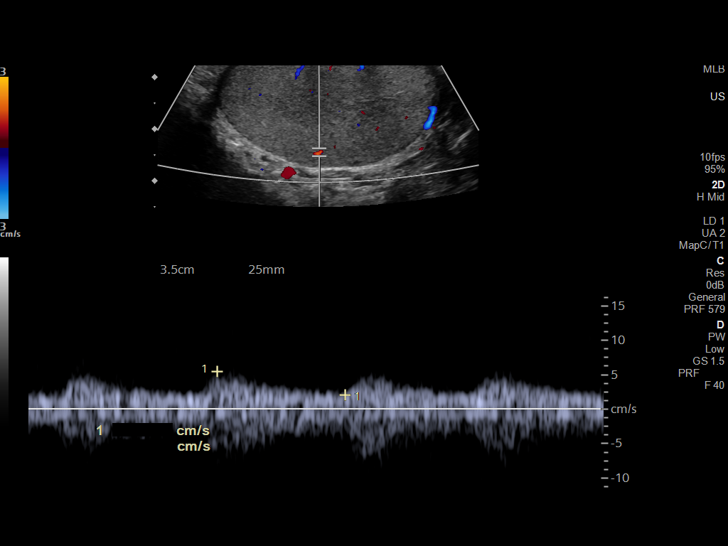
[im 38/46]
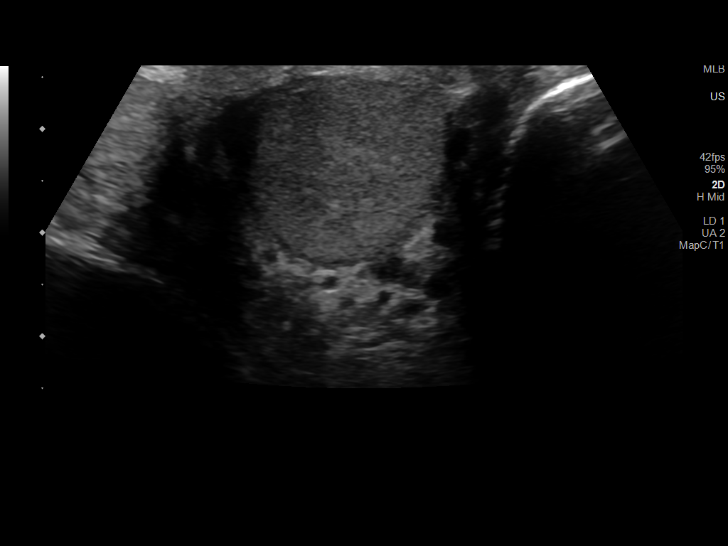
[im 42/46]
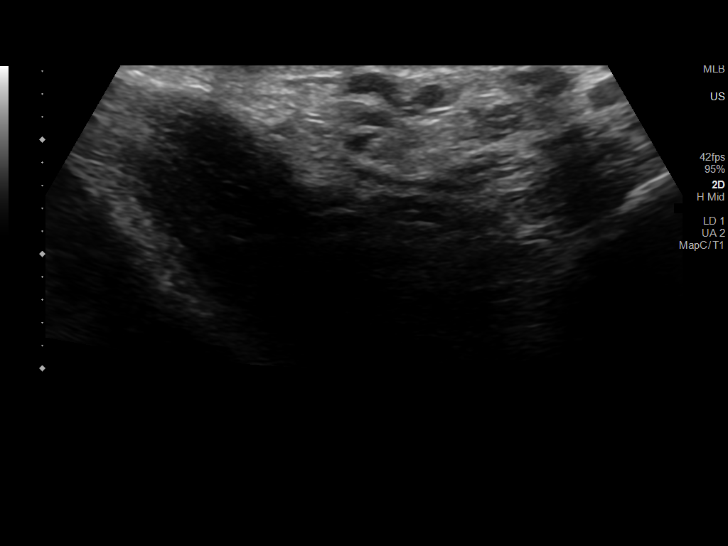
[im 46/46]
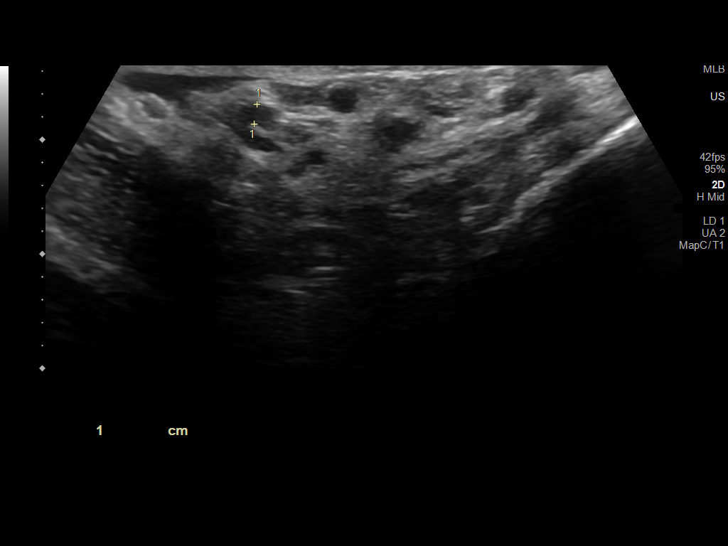

[14 of 25 positions shown; findings below may reference images not displayed]

FINDINGS: Right testicle

Measurements: 4.6 x 2.0 x 3.1 cm. No mass or microlithiasis
visualized.

Left testicle

Measurements: 4.3 x 2.2 x 2.5 cm. No mass or microlithiasis
visualized.

Right epididymis: Normal in size and appearance. No hypervascularity
(image 18).

Left epididymis:  Normal in size and appearance.

Hydrocele:  None visualized.

Varicocele:  None visualized.

Pulsed Doppler interrogation of both testes demonstrates symmetric,
normal low resistance arterial and venous waveforms bilaterally.
IMPRESSION: Normal scrotal ultrasound with Doppler. Negative for torsion or
mass.

## 2024-02-14 ENCOUNTER — Other Ambulatory Visit: Payer: Self-pay

## 2024-02-14 ENCOUNTER — Emergency Department (HOSPITAL_COMMUNITY)

## 2024-02-14 ENCOUNTER — Encounter (HOSPITAL_COMMUNITY): Payer: Self-pay

## 2024-02-14 ENCOUNTER — Emergency Department (HOSPITAL_COMMUNITY)
Admission: EM | Admit: 2024-02-14 | Discharge: 2024-02-14 | Disposition: A | Discharge status: Home / Self Care | Attending: Emergency Medicine | Admitting: Emergency Medicine

## 2024-02-14 DIAGNOSIS — R519 Headache, unspecified: Secondary | ICD-10-CM | POA: Insufficient documentation

## 2024-02-14 LAB — CBC WITH DIFFERENTIAL/PLATELET
Abs Immature Granulocytes: 0.01 K/uL (ref 0.00–0.07)
Basophils Absolute: 0 K/uL (ref 0.0–0.1)
Basophils Relative: 1 %
Eosinophils Absolute: 0.1 K/uL (ref 0.0–0.5)
Eosinophils Relative: 2 %
HCT: 42.6 % (ref 39.0–52.0)
Hemoglobin: 14.1 g/dL (ref 13.0–17.0)
Immature Granulocytes: 0 %
Lymphocytes Relative: 55 %
Lymphs Abs: 3.2 K/uL (ref 0.7–4.0)
MCH: 29.3 pg (ref 26.0–34.0)
MCHC: 33.1 g/dL (ref 30.0–36.0)
MCV: 88.4 fL (ref 80.0–100.0)
Monocytes Absolute: 0.5 K/uL (ref 0.1–1.0)
Monocytes Relative: 8 %
Neutro Abs: 2 K/uL (ref 1.7–7.7)
Neutrophils Relative %: 34 %
Platelets: 314 K/uL (ref 150–400)
RBC: 4.82 MIL/uL (ref 4.22–5.81)
RDW: 12.9 % (ref 11.5–15.5)
WBC: 5.8 K/uL (ref 4.0–10.5)
nRBC: 0 % (ref 0.0–0.2)

## 2024-02-14 LAB — COMPREHENSIVE METABOLIC PANEL WITH GFR
ALT: 33 U/L (ref 0–44)
AST: 25 U/L (ref 15–41)
Albumin: 3.9 g/dL (ref 3.5–5.0)
Alkaline Phosphatase: 66 U/L (ref 38–126)
Anion gap: 10 (ref 5–15)
BUN: 12 mg/dL (ref 6–20)
CO2: 26 mmol/L (ref 22–32)
Calcium: 9.3 mg/dL (ref 8.9–10.3)
Chloride: 101 mmol/L (ref 98–111)
Creatinine, Ser: 1.04 mg/dL (ref 0.61–1.24)
GFR, Estimated: >60 mL/min (ref >60)
Glucose, Bld: 106 mg/dL — ABNORMAL HIGH (ref 70–99)
Potassium: 4.3 mmol/L (ref 3.5–5.1)
Sodium: 137 mmol/L (ref 135–145)
Total Bilirubin: 0.7 mg/dL (ref 0.0–1.2)
Total Protein: 7.1 g/dL (ref 6.5–8.1)

## 2024-02-14 LAB — URINALYSIS, ROUTINE W REFLEX MICROSCOPIC
Glucose, UA: NEGATIVE mg/dL
Hgb urine dipstick: NEGATIVE
Ketones, ur: 5 mg/dL — AB
Leukocytes,Ua: NEGATIVE
Nitrite: NEGATIVE
Protein, ur: NEGATIVE mg/dL
Specific Gravity, Urine: 1.021 (ref 1.005–1.030)
pH: 7 (ref 5.0–8.0)

## 2024-02-14 MED ORDER — PROCHLORPERAZINE EDISYLATE 10 MG/2ML IJ SOLN
10.0000 mg | Freq: Once | INTRAMUSCULAR | Status: DC
  Filled 2024-02-14: qty 2

## 2024-02-14 MED ORDER — KETOROLAC TROMETHAMINE 15 MG/ML IJ SOLN
15.0000 mg | Freq: Once | INTRAMUSCULAR | Status: DC
  Filled 2024-02-14: qty 1

## 2024-02-14 MED ORDER — DIPHENHYDRAMINE HCL 50 MG/ML IJ SOLN
25.0000 mg | Freq: Once | INTRAMUSCULAR | Status: DC
  Filled 2024-02-14: qty 1

## 2024-02-14 NOTE — ED Triage Notes (Signed)
 Here by POV from home for HA. Ongoing over last week. No relief with acetaminophen . Verbalizes taking too much acetaminophen . Mentions R eye twitching. Alert, NAD, calm, interactive, resps e/u, speaking in clear complete sentences. Skin W&D. Steady gait.

## 2024-02-14 NOTE — Discharge Instructions (Signed)
 Thank you for letting us  evaluate you today.  Your CT head of your

## 2024-02-14 NOTE — ED Notes (Signed)
 Patient refusing IV at this time. Patient reports he took goody powder while waiting in the lobby and headache has resolved. Provider notified.

## 2024-02-14 NOTE — ED Provider Triage Note (Signed)
 Emergency Medicine Provider Triage Evaluation Note  [name] , a 38 y.o. male  was evaluated in triage.  Pt complains of headache x 1 week. Patient states it is the worst headache of his life and wakes him up out of his sleep. Denies current visual disturbances. States that he has been taking a lot of goody powder and has a slight ache in his stomach. Denies nausea or vomiting.   Review of Systems  Positive: Headache, abdominal pain Negative: Fever, chills, visual disturbances, chest pain, shortness of breath, urinary symptoms  Physical Exam  BP 132/80   Pulse 75   Temp 97.9 F (36.6 C) (Oral)   Resp 20   Ht 5' 8 (1.727 m)   Wt 81.6 kg   SpO2 98%   BMI 27.37 kg/m  Gen:   Awake, no distress   Resp:  Normal effort  MSK:   Moves extremities without difficulty  Other:  Vision grossly intact in triage, no focal neurologic deficit  Medical Decision Making  Medically screening exam initiated at 2:38 PM.  Appropriate orders placed.  [name] was informed that the remainder of the evaluation will be completed by another provider, this initial triage assessment does not replace that evaluation, and the importance of remaining in the ED until their evaluation is complete.  Orders: CBC, CMP, UA, Ct head without contrast   Melvin Terrall FALCON, PA-C 02/14/24 1443

## 2024-02-14 NOTE — ED Provider Notes (Signed)
 [place] EMERGENCY DEPARTMENT AT [facility] Provider Note   CSN: [id number] Arrival date & time: 02/14/24  1316     Patient presents with: Headache   [name] is a 38 y.o. male with no pertinent past medical history presents to Emergency Department for evaluation of headaches over the past week.  Reports that he typically gets a headache in the evening time but can happen anytime during the day.  These headaches completely resolve with Goody powder.  He has been having Goody powder once a day for the past week for these headaches.  Endorses that he believes he has had 7 headaches over the past week.  He describes headache as 1 that gradually worsens but he reports concern that it may worsen to the worst headache of my life so quickly rushes to have Goody powder for headache but has not experienced the worst headache of his life this week.  Reports that over the past 5 weeks he is been sleeping less than 5 hours at night secondary to new child at home.  Last headache started at 8 AM this morning and resolved following having Goody powder.  No illicit drug use.  Denies traumatic injury, visual disturbances, slurred speech, numbness or weakness, fevers, neck rigidity    Headache      Prior to Admission medications   Medication Sig Start Date End Date Taking? Authorizing Provider  amphetamine-dextroamphetamine (ADDERALL XR) 20 MG 24 hr capsule Take 20 mg by mouth daily. 02/17/22   [provider]  naproxen  (NAPROSYN ) 500 MG tablet Take 1 tablet (500 mg total) by mouth 2 (two) times daily. Patient not taking: Reported on 02/26/2022 04/28/19   Khatri, Hina, PA-C  pantoprazole  (PROTONIX ) 20 MG tablet Take 1 tablet (20 mg total) by mouth daily. Patient not taking: Reported on 02/26/2022 04/16/19   Blaise Aleene KIDD, MD    Allergies: Natural vegetable orange [psyllium]    Review of Systems  Neurological:  Positive for headaches.    Updated Vital Signs BP (!)  125/56 (BP Location: Right Arm)   Pulse 66   Temp 98.1 F (36.7 C)   Resp 16   Ht 5' 8 (1.727 m)   Wt 81.6 kg   SpO2 96%   BMI 27.37 kg/m   Physical Exam Vitals and nursing note reviewed.  Constitutional:      General: He is not in acute distress.    Appearance: Normal appearance. He is not ill-appearing.  HENT:     Head: Normocephalic and atraumatic.  Eyes:     General: Lids are normal. Vision grossly intact. No visual field deficit.    Extraocular Movements: Extraocular movements intact.     Right eye: Normal extraocular motion and no nystagmus.     Left eye: Normal extraocular motion and no nystagmus.     Conjunctiva/sclera: Conjunctivae normal.     Pupils: Pupils are equal, round, and reactive to light.     Comments: No horizontal nor vertical nystagmus.  Vision grossly intact  Cardiovascular:     Rate and Rhythm: Normal rate.     Heart sounds: Normal heart sounds.  Pulmonary:     Effort: Pulmonary effort is normal. No respiratory distress.     Breath sounds: Normal breath sounds.  Musculoskeletal:     Cervical back: Normal range of motion and neck supple. No rigidity.  Skin:    Coloration: Skin is not jaundiced or pale.  Neurological:     General: No focal deficit present.  Mental Status: He is alert and oriented to person, place, and time. Mental status is at baseline.     GCS: GCS eye subscore is 4. GCS verbal subscore is 5. GCS motor subscore is 6.     Cranial Nerves: No cranial nerve deficit, dysarthria or facial asymmetry.     Sensory: No sensory deficit.     Motor: No weakness, abnormal muscle tone, seizure activity or pronator drift.     Coordination: Coordination normal. Finger-Nose-Finger Test and Heel to [facility] Test normal.     Gait: Gait normal.     Deep Tendon Reflexes: Reflexes normal.     Comments: Motor 5/5 and sensation 2/2 BUE and BLE.  No slurred speech nor speech deficits.  No dizziness     (all labs ordered are listed, but only abnormal  results are displayed) Labs Reviewed  COMPREHENSIVE METABOLIC PANEL WITH GFR - Abnormal; Notable for the following components:      Result Value   Glucose, Bld 106 (*)    All other components within normal limits  URINALYSIS, ROUTINE W REFLEX MICROSCOPIC - Abnormal; Notable for the following components:   APPearance HAZY (*)    Bilirubin Urine MODERATE (*)    Ketones, ur 5 (*)    All other components within normal limits  CBC WITH DIFFERENTIAL/PLATELET    EKG: None  Radiology: CT Head Wo Contrast Result Date: 02/14/2024 CLINICAL DATA:  Worst headache of life most prominent right retro-orbital region. EXAM: CT HEAD WITHOUT CONTRAST TECHNIQUE: Contiguous axial images were obtained from the base of the skull through the vertex without intravenous contrast. RADIATION DOSE REDUCTION: This exam was performed according to the departmental dose-optimization program which includes automated exposure control, adjustment of the mA and/or kV according to patient size and/or use of iterative reconstruction technique. COMPARISON:  05/02/2022 FINDINGS: Brain: Ventricles, cisterns and other CSF spaces are normal. No mass, mass effect, shift of midline structures or acute hemorrhage. No acute infarction. Vascular: No hyperdense vessel or unexpected calcification. Skull: Normal. Negative for fracture or focal lesion. Sinuses/Orbits: Orbits are normal.  Paranasal sinuses are clear. Other: None. IMPRESSION: No acute findings. Electronically Signed   By: Toribio Agreste M.D.   On: 02/14/2024 15:17    Medications Ordered in the ED - No data to display                                   Medical Decision Making Amount and/or Complexity of Data Reviewed Labs: ordered.  Risk Prescription drug management.   Patient presents to the ED for concern of headache, this involves an extensive number of treatment options, and is a complaint that carries with it a high risk of complications and morbidity.  The  differential diagnosis includes ICH, SAH, meningitis, infection, electrolyte abnormality, hypertensive crisis, migraine, dehydration.  Nonexhaustive list   Co morbidities that complicate the patient evaluation  None   Additional history obtained:  Additional history obtained from Nursing   External records from outside source obtained and reviewed including triage RN note   Lab Tests:  I Ordered, and personally interpreted labs.  The pertinent results include:   CBG 106 UA notable for moderate bili   Imaging Studies ordered:  I ordered imaging studies including CT head I independently visualized and interpreted imaging which showed no ICH I agree with the radiologist interpretation     Problem List / ED Course:  HA Currently resolved following  Goody powder Neurologically intact with no motor or sensory deficits.  No speech deficits no visual deficits. No significant hypertension.  No signs of EOD No nuchal rigidity nor decreased ROM of neck.  No leukocytosis nor fever.  Low suspicion for meningitis. Lab work notable for bilirubin and UA but no signs of elevated LFTs.  No jaundice nor ascites on exam.  No RUQ abdominal pain.  No anemia.  Does not take daily medication other than goody powder over past week. Did discuss alternating ibuprofen  for pain relief to ensure no liver damage.  Denies caffeine, EtOH, illicit drug use Did offer patient migraine cocktail however he refused this as he reports that his headache is resolved and he does not currently have a headache CT head without ICH.  Had extensive conversation regarding that I am unable to completely rule out [facility] as etiology of headache as he has had multiple headaches over the past week.  Initially, he did endorse that he had the worst headache of his life but then retracted and stated that he is afraid that he is going to get the worst headache of his life and the headaches gradually worsen.  I discussed the process of  obtaining LP as the only way to confirm whether these headaches are related to [facility].  Shared decision making is had with patient and he does not wish to proceed with CTA, LP to rule out SAH.  I think this is reasonable as the Goody powder have been resolving the headaches and headaches are likely secondary to decreased sleep at night from his new child at home. He requests to be DC following discussed of ED workup as his HA has resolved Discussed symptomatic treatment at home to include decrease screen time, increasing rest, ensuring adequate hydration   Reevaluation:  After the interventions noted above, I reevaluated the patient and found that they have :resolved    Dispostion:  After consideration of the diagnostic results and the patients response to treatment, I feel that the patent would benefit from outpatient management neurology follow-up.   Discussed ED workup, disposition, return to ED precautions with patient who expresses understanding agrees with plan.  All questions answered to their satisfaction.  They are agreeable to plan.  Discharge instructions provided on paperwork  Final diagnoses:  Acute nonintractable headache, unspecified headache type    ED Discharge Orders     None        Minnie Tinnie BRAVO, PA 02/14/24 2245    Yolande Lamar BROCKS, MD 02/15/24 1054

## 2024-03-15 ENCOUNTER — Encounter (HOSPITAL_COMMUNITY): Payer: Self-pay | Admitting: Emergency Medicine

## 2024-03-15 ENCOUNTER — Emergency Department (HOSPITAL_COMMUNITY)
Admission: EM | Admit: 2024-03-15 | Discharge: 2024-03-16 | Disposition: A | Discharge status: Home / Self Care | Attending: Emergency Medicine | Admitting: Emergency Medicine

## 2024-03-15 ENCOUNTER — Other Ambulatory Visit: Payer: Self-pay

## 2024-03-15 DIAGNOSIS — M79604 Pain in right leg: Secondary | ICD-10-CM | POA: Insufficient documentation

## 2024-03-15 DIAGNOSIS — R42 Dizziness and giddiness: Secondary | ICD-10-CM | POA: Insufficient documentation

## 2024-03-15 LAB — URINALYSIS, ROUTINE W REFLEX MICROSCOPIC
Bacteria, UA: NONE SEEN
Bilirubin Urine: NEGATIVE
Glucose, UA: NEGATIVE mg/dL
Hgb urine dipstick: NEGATIVE
Ketones, ur: NEGATIVE mg/dL
Leukocytes,Ua: NEGATIVE
Nitrite: NEGATIVE
Protein, ur: NEGATIVE mg/dL
Specific Gravity, Urine: 1.023 (ref 1.005–1.030)
pH: 8 (ref 5.0–8.0)

## 2024-03-15 LAB — CBC WITH DIFFERENTIAL/PLATELET
Abs Immature Granulocytes: 0.01 K/uL (ref 0.00–0.07)
Basophils Absolute: 0 K/uL (ref 0.0–0.1)
Basophils Relative: 1 %
Eosinophils Absolute: 0.1 K/uL (ref 0.0–0.5)
Eosinophils Relative: 1 %
HCT: 41.2 % (ref 39.0–52.0)
Hemoglobin: 13.6 g/dL (ref 13.0–17.0)
Immature Granulocytes: 0 %
Lymphocytes Relative: 46 %
Lymphs Abs: 2.1 K/uL (ref 0.7–4.0)
MCH: 29.8 pg (ref 26.0–34.0)
MCHC: 33 g/dL (ref 30.0–36.0)
MCV: 90.2 fL (ref 80.0–100.0)
Monocytes Absolute: 0.5 K/uL (ref 0.1–1.0)
Monocytes Relative: 12 %
Neutro Abs: 1.8 K/uL (ref 1.7–7.7)
Neutrophils Relative %: 40 %
Platelets: 273 K/uL (ref 150–400)
RBC: 4.57 MIL/uL (ref 4.22–5.81)
RDW: 12.8 % (ref 11.5–15.5)
WBC: 4.5 K/uL (ref 4.0–10.5)
nRBC: 0 % (ref 0.0–0.2)

## 2024-03-15 LAB — COMPREHENSIVE METABOLIC PANEL WITH GFR
ALT: 37 U/L (ref 0–44)
AST: 24 U/L (ref 15–41)
Albumin: 3.7 g/dL (ref 3.5–5.0)
Alkaline Phosphatase: 61 U/L (ref 38–126)
Anion gap: 8 (ref 5–15)
BUN: 10 mg/dL (ref 6–20)
CO2: 27 mmol/L (ref 22–32)
Calcium: 8.7 mg/dL — ABNORMAL LOW (ref 8.9–10.3)
Chloride: 103 mmol/L (ref 98–111)
Creatinine, Ser: 1.13 mg/dL (ref 0.61–1.24)
GFR, Estimated: >60 mL/min (ref >60)
Glucose, Bld: 109 mg/dL — ABNORMAL HIGH (ref 70–99)
Potassium: 3.8 mmol/L (ref 3.5–5.1)
Sodium: 138 mmol/L (ref 135–145)
Total Bilirubin: 0.7 mg/dL (ref 0.0–1.2)
Total Protein: 6.8 g/dL (ref 6.5–8.1)

## 2024-03-15 NOTE — ED Triage Notes (Signed)
 Pt arrive POV with multiple c/o, dizziness, leg pain and numbness for the past few weeks, pt denies any fall or injury, AO x 4 during triage no neuro deficit noticed.

## 2024-03-16 ENCOUNTER — Emergency Department (HOSPITAL_COMMUNITY)

## 2024-03-16 DIAGNOSIS — M79604 Pain in right leg: Secondary | ICD-10-CM | POA: Diagnosis not present

## 2024-03-16 NOTE — ED Provider Notes (Signed)
 [place] EMERGENCY DEPARTMENT AT [facility] Provider Note   CSN: [id number] Arrival date & time: 03/15/24  2026     Patient presents with: Dizziness   [name] is a 38 y.o. male with overall noncontributory past medical history who presents with concern for multiple blades today.  Patient reports feeling somewhat lightheaded, dizzy with standing, endorses tingling in bilateral lower extremities, felt a sharp pain in the back of his right leg.  Worried about DVT.  Denies any shortness of breath, denies any chest pain.  No recent travel, no recent surgery.  No history of blood clots.  Denies feeling of heart racing.  No alcohol or drug use.  Reports he feels like he has been eating and drinking the way he normally does.    Dizziness      Prior to Admission medications  Medication Sig Start Date End Date Taking? Authorizing Provider  amphetamine-dextroamphetamine (ADDERALL XR) 20 MG 24 hr capsule Take 20 mg by mouth daily. 02/17/22   [provider]  naproxen  (NAPROSYN ) 500 MG tablet Take 1 tablet (500 mg total) by mouth 2 (two) times daily. Patient not taking: Reported on 02/26/2022 04/28/19   Khatri, Hina, PA-C  pantoprazole  (PROTONIX ) 20 MG tablet Take 1 tablet (20 mg total) by mouth daily. Patient not taking: Reported on 02/26/2022 04/16/19   Blaise Aleene KIDD, MD    Allergies: Natural vegetable orange [psyllium]    Review of Systems  Neurological:  Positive for dizziness.  All other systems reviewed and are negative.   Updated Vital Signs BP 122/78 (BP Location: Right Arm)   Pulse 85   Temp 97.8 F (36.6 C)   Resp 18   Ht 5' 8 (1.727 m)   Wt 82 kg   SpO2 94%   BMI 27.49 kg/m   Physical Exam Vitals and nursing note reviewed.  Constitutional:      General: He is not in acute distress.    Appearance: Normal appearance.  HENT:     Head: Normocephalic and atraumatic.  Eyes:     General:        Right eye: No discharge.        Left  eye: No discharge.  Cardiovascular:     Rate and Rhythm: Normal rate and regular rhythm.     Heart sounds: No murmur heard.    No friction rub. No gallop.  Pulmonary:     Effort: Pulmonary effort is normal.     Breath sounds: Normal breath sounds.  Abdominal:     General: Bowel sounds are normal.     Palpations: Abdomen is soft.  Musculoskeletal:     Comments: Normal range of motion to flexion, extension of bilateral lower extremities, normal appearance, without soft tissue swelling of bilateral lower extremities.  Skin:    General: Skin is warm and dry.     Capillary Refill: Capillary refill takes less than 2 seconds.  Neurological:     Mental Status: He is alert and oriented to person, place, and time.     Comments: Moves all 4 limbs spontaneously, CN II through XII grossly intact, can ambulate without difficulty, intact sensation throughout.  Psychiatric:        Mood and Affect: Mood normal.        Behavior: Behavior normal.     (all labs ordered are listed, but only abnormal results are displayed) Labs Reviewed  COMPREHENSIVE METABOLIC PANEL WITH GFR - Abnormal; Notable for the following components:  Result Value   Glucose, Bld 109 (*)    Calcium 8.7 (*)    All other components within normal limits  CBC WITH DIFFERENTIAL/PLATELET  URINALYSIS, ROUTINE W REFLEX MICROSCOPIC    EKG: None  Radiology: VAS US  LOWER EXTREMITY VENOUS (DVT) (7a-7p) Result Date: 03/16/2024  Lower Venous DVT Study Patient Name:  [name]  Date of Exam:   03/16/2024 Medical Rec #: [id number]        Accession #:    [id number] Date of Birth: 10-18-1985       Patient Gender: M Patient Age:   68 years Exam Location:  [facility] Procedure:      VAS US  LOWER EXTREMITY VENOUS (DVT) Referring Phys: Hawley Michel --------------------------------------------------------------------------------  Indications: Proximal posterior thigh pain.  Comparison Study: No previous exams Performing  Technologist: Jody Hill RVT, RDMS  Examination Guidelines: A complete evaluation includes B-mode imaging, spectral Doppler, color Doppler, and power Doppler as needed of all accessible portions of each vessel. Bilateral testing is considered an integral part of a complete examination. Limited examinations for reoccurring indications may be performed as noted. The reflux portion of the exam is performed with the patient in reverse Trendelenburg.  +---------+---------------+---------+-----------+----------+--------------+ RIGHT    CompressibilityPhasicitySpontaneityPropertiesThrombus Aging +---------+---------------+---------+-----------+----------+--------------+ CFV      Full           Yes      Yes                                 +---------+---------------+---------+-----------+----------+--------------+ SFJ      Full                                                        +---------+---------------+---------+-----------+----------+--------------+ FV Prox  Full           Yes      Yes                                 +---------+---------------+---------+-----------+----------+--------------+ FV Mid   Full           Yes      Yes                                 +---------+---------------+---------+-----------+----------+--------------+ FV DistalFull           Yes      Yes                                 +---------+---------------+---------+-----------+----------+--------------+ PFV      Full                                                        +---------+---------------+---------+-----------+----------+--------------+ POP      Full           Yes      Yes                                 +---------+---------------+---------+-----------+----------+--------------+  PTV      Full                                                        +---------+---------------+---------+-----------+----------+--------------+ PERO     Full                                                         +---------+---------------+---------+-----------+----------+--------------+   +----+---------------+---------+-----------+----------+--------------+ LEFTCompressibilityPhasicitySpontaneityPropertiesThrombus Aging +----+---------------+---------+-----------+----------+--------------+ CFV Full           Yes      Yes                                 +----+---------------+---------+-----------+----------+--------------+     Summary: RIGHT: - There is no evidence of deep vein thrombosis in the lower extremity.  - No cystic structure found in the popliteal fossa.  LEFT: - No evidence of common femoral vein obstruction.   *See table(s) above for measurements and observations.    Preliminary      Procedures   Medications Ordered in the ED - No data to display                                  Medical Decision Making Amount and/or Complexity of Data Reviewed Labs: ordered.   This patient is a 38 y.o. male  who presents to the ED for concern of dizziness, leg pain.   Differential diagnoses prior to evaluation: The emergent differential diagnosis includes, but is not limited to,  BPPV, vestibular migraine, head trauma, AVM, intracranial tumor, multiple sclerosis, drug-related, CVA, orthostatic hypotension, sepsis, hypoglycemia, electrolyte disturbance, anemia, anxiety, DVT, PE, ACS, versus other. This is not an exhaustive differential.   Past Medical History / Co-morbidities / Social History: Overall noncontributory  Physical Exam: Physical exam performed. The pertinent findings include: Vital signs stable in the emergency department, overall normal appearance of bilateral lower extremities.  Patient can stand without difficulty, no difficulty with coordination, ambulation, no focal neurodeficits throughout.  Lab Tests/Imaging studies: I personally interpreted labs/imaging and the pertinent results include: CBC unremarkable, CMP unremarkable, UA unremarkable, negative  for DVT on ultrasound.. I agree with the radiologist interpretation.  Cardiac monitoring: EKG obtained and interpreted by myself and attending physician which shows: Normal sinus rhythm, no acute ST ST changes  Disposition: After consideration of the diagnostic results and the patients response to treatment, I feel that no evidence of blood clot, acute cardiac abnormality, electrolyte abnormality, or other clear explanation for patient's symptoms.   emergency department workup does not suggest an emergent condition requiring admission or immediate intervention beyond what has been performed at this time. The plan is: Encourage PCP follow-up, plenty of fluids, rest.. The patient is safe for discharge and has been instructed to return immediately for worsening symptoms, change in symptoms or any other concerns.  Final diagnoses:  Dizziness  Pain of right lower extremity    ED Discharge Orders     None          Rosan Sherlean DEL, [place] 03/16/24 [id number]  Tegeler, Lonni PARAS, MD 03/16/24 1056

## 2024-03-16 NOTE — Progress Notes (Signed)
 RLE venous duplex has been completed.  Preliminary results given to Christian Prosperi, PA-C.   Results can be found under chart review under CV PROC. 03/16/2024 8:53 AM Natalina Wieting RVT, RDMS

## 2024-03-16 NOTE — Discharge Instructions (Signed)
 Please use Tylenol  or ibuprofen  for pain.  You may use 600 mg ibuprofen  every 6 hours or 1000 mg of Tylenol  every 6 hours.  You may choose to alternate between the 2.  This would be most effective.  Not to exceed 4 g of Tylenol  within 24 hours.  Not to exceed 3200 mg ibuprofen  24 hours.  Make sure that you are drinking plenty of fluids, eating 3 meals a day.  Return to the emergency department if you have episode of passing out, chest pain, shortness of breath, worsening episodes of dizziness.

## 2024-05-07 ENCOUNTER — Other Ambulatory Visit: Payer: Self-pay

## 2024-05-07 ENCOUNTER — Emergency Department (HOSPITAL_COMMUNITY)
Admission: EM | Admit: 2024-05-07 | Discharge: 2024-05-08 | Disposition: A | Attending: Emergency Medicine | Admitting: Emergency Medicine

## 2024-05-07 ENCOUNTER — Encounter (HOSPITAL_COMMUNITY): Payer: Self-pay

## 2024-05-07 DIAGNOSIS — Z79899 Other long term (current) drug therapy: Secondary | ICD-10-CM | POA: Insufficient documentation

## 2024-05-07 DIAGNOSIS — K6289 Other specified diseases of anus and rectum: Secondary | ICD-10-CM | POA: Insufficient documentation

## 2024-05-07 LAB — CBC
HCT: 43.1 % (ref 39.0–52.0)
Hemoglobin: 14.4 g/dL (ref 13.0–17.0)
MCH: 29.6 pg (ref 26.0–34.0)
MCHC: 33.4 g/dL (ref 30.0–36.0)
MCV: 88.7 fL (ref 80.0–100.0)
Platelets: 310 10*3/uL (ref 150–400)
RBC: 4.86 MIL/uL (ref 4.22–5.81)
RDW: 12.6 % (ref 11.5–15.5)
WBC: 6.2 10*3/uL (ref 4.0–10.5)
nRBC: 0 % (ref 0.0–0.2)

## 2024-05-07 LAB — URINALYSIS, ROUTINE W REFLEX MICROSCOPIC
Bacteria, UA: NONE SEEN
Bilirubin Urine: NEGATIVE
Glucose, UA: NEGATIVE mg/dL
Ketones, ur: NEGATIVE mg/dL
Leukocytes,Ua: NEGATIVE
Nitrite: NEGATIVE
Protein, ur: NEGATIVE mg/dL
Specific Gravity, Urine: 1.023 (ref 1.005–1.030)
pH: 5 (ref 5.0–8.0)

## 2024-05-07 LAB — BASIC METABOLIC PANEL WITH GFR
Anion gap: 11 (ref 5–15)
BUN: 12 mg/dL (ref 6–20)
CO2: 26 mmol/L (ref 22–32)
Calcium: 9.8 mg/dL (ref 8.9–10.3)
Chloride: 103 mmol/L (ref 98–111)
Creatinine, Ser: 1.09 mg/dL (ref 0.61–1.24)
GFR, Estimated: >60 mL/min
Glucose, Bld: 98 mg/dL (ref 70–99)
Potassium: 4 mmol/L (ref 3.5–5.1)
Sodium: 139 mmol/L (ref 135–145)

## 2024-05-07 NOTE — ED Triage Notes (Signed)
 He also has complaints of sciatica on the right leg

## 2024-05-07 NOTE — ED Triage Notes (Signed)
 Pt c/o rectal pain w/narrowing stools, states feels a mass in rectal areax28yr. Pt c/o bladder [id number]

## 2024-05-08 MED ORDER — HYOSCYAMINE SULFATE 0.125 MG SL SUBL
0.1250 mg | SUBLINGUAL_TABLET | Freq: Once | SUBLINGUAL | Status: AC
  Administered 2024-05-08: 0.125 mg via ORAL
  Filled 2024-05-08: qty 1

## 2024-05-08 MED ORDER — HYOSCYAMINE SULFATE SL 0.125 MG SL SUBL: 1.0000 | SUBLINGUAL_TABLET | Freq: Four times a day (QID) | SUBLINGUAL | 0 refills | Status: AC | PRN

## 2024-05-08 NOTE — ED Notes (Signed)
Patient verbalizes understanding of discharge instructions. Opportunity for questioning and answers were provided. Armband removed by staff, pt discharged from ED. Ambulated out to lobby  

## 2024-05-08 NOTE — ED Provider Notes (Signed)
 " [place] EMERGENCY DEPARTMENT AT [facility] Provider Note  CSN: [id number] Arrival date & time: 05/07/24 1919  Chief Complaint(s) No chief complaint on file.  History provided by patient. HPI & MDM [name] is a 39 y.o. male here for rectal pain   HPI Rectal pain has been ongoing for over a year, constantly for approximately 9 months at a baseline 3-4 out of 10 level of intensity.  Described as a dullness/soreness.  Intermittent sharp stabbing pains with certain positions or even spontaneously.  Patient is reporting thin stools.  No pain with bowel movements.  No bloody bowel movements.  No difficulty voiding.  Patient is concern for possible mass stating that his ex-girlfriend is HPV positive and was recently diagnosed with ovarian cancer.  Patient has discussed his symptoms with his PCP several times and reports exam was not performed.   Medical Decision Making Amount and/or Complexity of Data Reviewed Labs: ordered. Decision-making details documented in ED Course.  Risk Prescription drug management.    Medical Decision Making:  The patient's presentation involves an extensive number of treatment options, and the complaint(s) carry with it a high risk of complications and morbidity. The differential diagnosis includes but not limited to those listed below:  Exam does not show evidence of external hemorrhoids.  No palpable internal hemorrhoids appreciated.  Prostate is normal size and nontender.  Patient did not have any obvious palpable masses on DRE. Labs are reassuring without electrolyte derangements.  No severe anemia.  UA without evidence of infection urine.  No need for emergent imaging at this time.  Considering possible proctalgia fugax, but given his concern, recommended follow-up with GI for colonoscopy, urology for PSA.   Final Clinical Impression(s) / ED Diagnoses Final diagnoses:  Rectal pain   The patient appears reasonably screened and/or  stabilized for discharge and I doubt any other medical condition or other [facility] requiring further screening, evaluation, or treatment in the ED at this time. I have discussed the findings, Dx and Tx plan with the patient/family who expressed understanding and agree(s) with the plan. Discharge instructions discussed at length. The patient/family was given strict return precautions who verbalized understanding of the instructions. No further questions at time of discharge.  Disposition: Discharge  Condition: Good  ED Discharge Orders          Ordered    Hyoscyamine  Sulfate SL (LEVSIN /SL) 0.125 MG SUBL  4 times daily PRN        05/08/24 0341              Follow Up: Delayne Artist PARAS, MD [address] [place] [address] [phone]  Call  to schedule an appointment for close follow up  Gastroenterology, Margarete [address] [place] [address] [phone]  Call  to schedule an appointment for close follow up, as needed  [facility] [address] [place] [address] [place]  [address] [phone] Call  as needed     Past Medical History History reviewed. No pertinent past medical history. There are no active problems to display for this patient.  Home Medication(s) Prior to Admission medications  Medication Sig Start Date End Date Taking? Authorizing Provider  Hyoscyamine  Sulfate SL (LEVSIN /SL) 0.125 MG SUBL Place 1 tablet (0.125 mg total) under the tongue 4 (four) times daily as needed. 05/08/24 05/13/24 Yes Quashawn Jewkes, Raynell Moder, MD  amphetamine-dextroamphetamine (ADDERALL XR) 20 MG 24 hr capsule Take 20 mg by mouth daily. 02/17/22   [provider]  naproxen  (NAPROSYN ) 500 MG tablet Take 1 tablet (500 mg total) by mouth 2 (two) times daily. Patient not taking: Reported on 02/26/2022 04/28/19   Khatri, Hina, PA-C  pantoprazole  (PROTONIX ) 20 MG tablet Take 1 tablet (20 mg total) by mouth daily. Patient not taking: Reported  on 02/26/2022 04/16/19   Blaise Aleene KIDD, MD                                                                                                                                    Allergies Natural vegetable orange [psyllium]  Review of Systems Review of Systems As noted in HPI  Physical Exam Vital Signs  I have reviewed the triage vital signs BP (!) 140/96 (BP Location: Right Arm)   Pulse 72   Temp 98.2 F (36.8 C) (Oral)   Resp 18   Ht 5' 8 (1.727 m)   Wt 84.8 kg   SpO2 100%   BMI 28.43 kg/m   Physical Exam Vitals reviewed. Exam conducted with a chaperone present.  Constitutional:      General: He is not in acute distress.    Appearance: He is well-developed. He is not diaphoretic.  HENT:     Head: Normocephalic and atraumatic.     Right Ear: External ear normal.     Left Ear: External ear normal.     Nose: Nose normal.     Mouth/Throat:     Mouth: Mucous membranes are moist.  Eyes:     General: No scleral icterus.    Conjunctiva/sclera: Conjunctivae normal.  Neck:     Trachea: Phonation normal.  Cardiovascular:     Rate and Rhythm: Normal rate and regular rhythm.  Pulmonary:     Effort: Pulmonary effort is normal. No respiratory distress.     Breath sounds: No stridor.  Abdominal:     General: There is no distension.  Genitourinary:    Prostate: Normal. Not tender.     Rectum: No mass, tenderness, anal fissure, external hemorrhoid or internal hemorrhoid.  Musculoskeletal:        General: Normal range of motion.     Cervical back: Normal range of motion.  Neurological:     Mental Status: He is alert and oriented to person, place, and time.  Psychiatric:        Behavior: Behavior normal.     ED Results and Treatments Labs (all labs ordered are listed, but only abnormal results are displayed) Labs Reviewed  URINALYSIS, ROUTINE W REFLEX MICROSCOPIC - Abnormal; Notable for the following components:      Result Value   Hgb urine dipstick MODERATE (*)     All other components within normal limits  BASIC METABOLIC PANEL WITH GFR  CBC  EKG  EKG Interpretation Date/Time:    Ventricular Rate:    PR Interval:    QRS Duration:    QT Interval:    QTC Calculation:   R Axis:      Text Interpretation:         Radiology No results found.  Medications Ordered in ED Medications  hyoscyamine  (LEVSIN  SL) SL tablet 0.125 mg (has no administration in time range)   Procedures Procedures  (including critical care time)   This chart was dictated using voice recognition software.  Despite best efforts to proofread,  errors can occur which can change the documentation meaning.   Trine Raynell Moder, MD 05/08/24 [phone]  "
